# Patient Record
Sex: Female | Born: 1950 | Race: White | Hispanic: No | Marital: Married | Smoking: Former smoker
Health system: Southern US, Community
[De-identification: ages and names within clinical notes are randomized; demographics above are authoritative.]

## PROBLEM LIST (undated history)

## (undated) DIAGNOSIS — G709 Myoneural disorder, unspecified: Secondary | ICD-10-CM

## (undated) DIAGNOSIS — E079 Disorder of thyroid, unspecified: Secondary | ICD-10-CM

## (undated) DIAGNOSIS — G4730 Sleep apnea, unspecified: Secondary | ICD-10-CM

## (undated) DIAGNOSIS — I10 Essential (primary) hypertension: Secondary | ICD-10-CM

## (undated) DIAGNOSIS — E785 Hyperlipidemia, unspecified: Secondary | ICD-10-CM

## (undated) DIAGNOSIS — K219 Gastro-esophageal reflux disease without esophagitis: Secondary | ICD-10-CM

## (undated) HISTORY — DX: Disorder of thyroid, unspecified: E07.9

## (undated) HISTORY — PX: WISDOM TOOTH EXTRACTION: SHX21

## (undated) HISTORY — DX: Hyperlipidemia, unspecified: E78.5

## (undated) HISTORY — DX: Sleep apnea, unspecified: G47.30

## (undated) HISTORY — DX: Gastro-esophageal reflux disease without esophagitis: K21.9

## (undated) HISTORY — PX: HAMMER TOE SURGERY: SHX385

## (undated) HISTORY — DX: Myoneural disorder, unspecified: G70.9

## (undated) HISTORY — DX: Essential (primary) hypertension: I10

## (undated) HISTORY — PX: FRACTURE SURGERY: SHX138

---

## 1999-02-16 HISTORY — PX: WRIST FRACTURE SURGERY: SHX121

## 2011-02-16 HISTORY — PX: COLONOSCOPY: SHX174

## 2011-02-16 HISTORY — PX: OTHER SURGICAL HISTORY: SHX169

## 2012-08-01 DIAGNOSIS — R7303 Prediabetes: Secondary | ICD-10-CM | POA: Insufficient documentation

## 2013-03-23 DIAGNOSIS — H9190 Unspecified hearing loss, unspecified ear: Secondary | ICD-10-CM | POA: Insufficient documentation

## 2013-03-23 DIAGNOSIS — K219 Gastro-esophageal reflux disease without esophagitis: Secondary | ICD-10-CM | POA: Insufficient documentation

## 2018-02-15 HISTORY — PX: TREATMENT FISTULA ANAL: SUR1390

## 2018-07-05 DIAGNOSIS — Z86010 Personal history of colonic polyps: Secondary | ICD-10-CM | POA: Insufficient documentation

## 2018-07-05 DIAGNOSIS — M5136 Other intervertebral disc degeneration, lumbar region: Secondary | ICD-10-CM | POA: Insufficient documentation

## 2018-07-05 DIAGNOSIS — M51369 Other intervertebral disc degeneration, lumbar region without mention of lumbar back pain or lower extremity pain: Secondary | ICD-10-CM | POA: Insufficient documentation

## 2018-07-05 DIAGNOSIS — M8580 Other specified disorders of bone density and structure, unspecified site: Secondary | ICD-10-CM | POA: Insufficient documentation

## 2018-07-05 DIAGNOSIS — K648 Other hemorrhoids: Secondary | ICD-10-CM | POA: Insufficient documentation

## 2018-07-18 DIAGNOSIS — K603 Anal fistula: Secondary | ICD-10-CM | POA: Insufficient documentation

## 2019-12-14 ENCOUNTER — Ambulatory Visit (INDEPENDENT_AMBULATORY_CARE_PROVIDER_SITE_OTHER): Payer: Medicare Other | Admitting: Family

## 2019-12-14 ENCOUNTER — Other Ambulatory Visit: Payer: Self-pay

## 2019-12-14 ENCOUNTER — Encounter: Payer: Self-pay | Admitting: Family

## 2019-12-14 VITALS — BP 128/70 | HR 90 | Temp 98.5°F | Ht 65.0 in | Wt 247.0 lb

## 2019-12-14 DIAGNOSIS — Z1231 Encounter for screening mammogram for malignant neoplasm of breast: Secondary | ICD-10-CM

## 2019-12-14 DIAGNOSIS — M48061 Spinal stenosis, lumbar region without neurogenic claudication: Secondary | ICD-10-CM | POA: Diagnosis not present

## 2019-12-14 DIAGNOSIS — L661 Lichen planopilaris: Secondary | ICD-10-CM

## 2019-12-14 DIAGNOSIS — G4700 Insomnia, unspecified: Secondary | ICD-10-CM

## 2019-12-14 DIAGNOSIS — Z1211 Encounter for screening for malignant neoplasm of colon: Secondary | ICD-10-CM

## 2019-12-14 DIAGNOSIS — G4730 Sleep apnea, unspecified: Secondary | ICD-10-CM

## 2019-12-14 DIAGNOSIS — M79671 Pain in right foot: Secondary | ICD-10-CM

## 2019-12-14 DIAGNOSIS — E039 Hypothyroidism, unspecified: Secondary | ICD-10-CM

## 2019-12-14 DIAGNOSIS — E785 Hyperlipidemia, unspecified: Secondary | ICD-10-CM | POA: Insufficient documentation

## 2019-12-14 DIAGNOSIS — M79672 Pain in left foot: Secondary | ICD-10-CM

## 2019-12-14 DIAGNOSIS — I10 Essential (primary) hypertension: Secondary | ICD-10-CM

## 2019-12-14 DIAGNOSIS — L6612 Frontal fibrosing alopecia: Secondary | ICD-10-CM | POA: Insufficient documentation

## 2019-12-14 DIAGNOSIS — G4733 Obstructive sleep apnea (adult) (pediatric): Secondary | ICD-10-CM | POA: Insufficient documentation

## 2019-12-14 MED ORDER — PRAVASTATIN SODIUM 80 MG PO TABS: 80.0000 mg | ORAL_TABLET | Freq: Every day | ORAL | 1 refills | Status: DC

## 2019-12-14 MED ORDER — DILTIAZEM HCL ER BEADS 120 MG PO CP24: 120.0000 mg | ORAL_CAPSULE | Freq: Every day | ORAL | 1 refills | Status: DC

## 2019-12-14 MED ORDER — LEVOTHYROXINE SODIUM 112 MCG PO TABS: 112.0000 ug | ORAL_TABLET | Freq: Every day | ORAL | 1 refills | Status: DC

## 2019-12-14 MED ORDER — MELOXICAM 15 MG PO TABS: 15.0000 mg | ORAL_TABLET | Freq: Every day | ORAL | 1 refills | Status: DC

## 2019-12-14 MED ORDER — TRAZODONE HCL 100 MG PO TABS: 100.0000 mg | ORAL_TABLET | Freq: Every day | ORAL | 1 refills | Status: DC

## 2019-12-14 NOTE — Progress Notes (Signed)
Laura Hawkins is a 69 y.o. female with the following history as recorded in Epic[employer]:  Patient Active Problem List   Diagnosis Date Noted  . Spinal stenosis of lumbar region 12/14/2019  . Hypothyroidism 12/14/2019  . Essential hypertension 12/14/2019  . Hyperlipidemia 12/14/2019  . Insomnia 12/14/2019  . 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 Sleep apnea 12/14/2019    Current Outpatient Medications  Medication Sig Dispense Refill  . Cholecalciferol 25 MCG (1000 UT) capsule Take 2,000 Units by mouth daily.    Marland Kitchen diltiazem (TIAZAC) 120 MG 24 hr capsule Take 1 capsule (120 mg total) by mouth daily. 90 capsule 1  . hydroxychloroquine (PLAQUENIL) 200 MG tablet Take by mouth daily.    Marland Kitchen ibuprofen (ADVIL) 200 MG tablet Take 200 mg by mouth every 6 (six) hours as needed.    Marland Kitchen levothyroxine (SYNTHROID) 112 MCG tablet Take 1 tablet (112 mcg total) by mouth daily before breakfast. 90 tablet 1  . pravastatin (PRAVACHOL) 80 MG tablet Take 1 tablet (80 mg total) by mouth daily. 90 tablet 1  . traZODone (DESYREL) 100 MG tablet Take 1 tablet (100 mg total) by mouth at bedtime. 90 tablet 1  . meloxicam (MOBIC) 15 MG tablet Take 1 tablet (15 mg total) by mouth daily. 30 tablet 1   No current facility-administered medications for this visit.    Allergies: Penicillins, Bee venom, Poison ivy extract, and Poison oak extract  History reviewed. No pertinent past medical history.  Past Surgical History:  Procedure Laterality Date  . dislocated right elbow  2013  . TREATMENT FISTULA ANAL  2020  . WRIST FRACTURE SURGERY  2001    Family History  Problem Relation Age of Onset  . Dementia Mother     Social History   Tobacco Use  . Smoking status: Not on file  Substance Use Topics  . Alcohol use: Not on file    Subjective:  Patient presents as a new patient today; has recently moved to [facility] to help take care of her 28 yo mother;  Has already established with ophthalmology- Dr. Sabra Heck ( due to  use of hydroxychloroquine); dermatology will manage plaquenil; ( Plaquenil for frontal fibrosing alopecia);  Will need to get referral to neurosurgery for lumbar epidural follow-up;  Colonoscopy done in 2013- does know she had a polyp; unsure if it was hyperplastic or adenomatous; originally told needed 5 yr follow-up and then told that was wrong and had to 10 year follow-up;     Objective:  Vitals:   12/14/19 1023  BP: 128/70  Pulse: 90  Temp: 98.5 F (36.9 C)  TempSrc: Oral  SpO2: 95%  Weight: 247 lb (112 kg)  Height: 5\' 5"  (1.651 m)    General: Well developed, well nourished, in no acute distress  Skin : Warm and dry.  Head: Normocephalic and atraumatic  Lungs: Respirations unlabored; clear to auscultation bilaterally without wheeze, rales, rhonchi  CVS exam: normal rate and regular rhythm.  Neurologic: Alert and oriented; speech intact; face symmetrical; moves all extremities well; CNII-XII intact without focal deficit   Assessment:  1. Spinal stenosis of lumbar region, unspecified whether neurogenic claudication present   2. Encounter for screening colonoscopy   3. Screening mammogram for breast cancer   4. Bilateral foot pain   5. Hypothyroidism, unspecified type   6. Essential hypertension   7. Hyperlipidemia, unspecified hyperlipidemia type   8. Insomnia, unspecified type   9. Frontal fibrosing alopecia   10. Sleep apnea, unspecified type  Plan:  Refills updated and referrals updated as requested; Will plan for labs and Tdap in 3 months;   Time spent 30 minutes with patient;  This visit occurred during the SARS-CoV-2 public health emergency.  Safety protocols were in place, including screening questions prior to the visit, additional usage of staff PPE, and extensive cleaning of exam room while observing appropriate contact time as indicated for disinfecting solutions.     Return in about 3 months (around 03/15/2020) for AWV and OV with me.  Orders Placed  This Encounter  Procedures  . MM Digital Screening    Standing Status:   Future    Standing Expiration Date:   12/13/2020    Order Specific Question:   Reason for Exam (SYMPTOM  OR DIAGNOSIS REQUIRED)    Answer:   screening mammogram    Order Specific Question:   Preferred imaging location?    Answer:   [facility]  . Ambulatory referral to Neurosurgery    Referral Priority:   Routine    Referral Type:   Surgical    Referral Reason:   Specialty Services Required    Requested Specialty:   Neurosurgery    Number of Visits Requested:   1  . Ambulatory referral to Gastroenterology    Referral Priority:   Routine    Referral Type:   Consultation    Referral Reason:   Specialty Services Required    Number of Visits Requested:   1  . Ambulatory referral to Podiatry    Referral Priority:   Routine    Referral Type:   Consultation    Referral Reason:   Specialty Services Required    Requested Specialty:   Podiatry    Number of Visits Requested:   1    Requested Prescriptions   Signed Prescriptions Disp Refills  . meloxicam (MOBIC) 15 MG tablet 30 tablet 1    Sig: Take 1 tablet (15 mg total) by mouth daily.  Marland Kitchen diltiazem (TIAZAC) 120 MG 24 hr capsule 90 capsule 1    Sig: Take 1 capsule (120 mg total) by mouth daily.  Marland Kitchen levothyroxine (SYNTHROID) 112 MCG tablet 90 tablet 1    Sig: Take 1 tablet (112 mcg total) by mouth daily before breakfast.  . pravastatin (PRAVACHOL) 80 MG tablet 90 tablet 1    Sig: Take 1 tablet (80 mg total) by mouth daily.  . traZODone (DESYREL) 100 MG tablet 90 tablet 1    Sig: Take 1 tablet (100 mg total) by mouth at bedtime.

## 2020-01-02 ENCOUNTER — Encounter: Payer: Self-pay | Admitting: Podiatry

## 2020-01-02 ENCOUNTER — Other Ambulatory Visit: Payer: Self-pay | Admitting: Podiatry

## 2020-01-02 ENCOUNTER — Other Ambulatory Visit: Payer: Self-pay

## 2020-01-02 ENCOUNTER — Ambulatory Visit (INDEPENDENT_AMBULATORY_CARE_PROVIDER_SITE_OTHER): Payer: Medicare Other

## 2020-01-02 ENCOUNTER — Ambulatory Visit (INDEPENDENT_AMBULATORY_CARE_PROVIDER_SITE_OTHER): Payer: Medicare Other | Admitting: Podiatry

## 2020-01-02 DIAGNOSIS — B351 Tinea unguium: Secondary | ICD-10-CM | POA: Diagnosis not present

## 2020-01-02 DIAGNOSIS — M778 Other enthesopathies, not elsewhere classified: Secondary | ICD-10-CM | POA: Diagnosis not present

## 2020-01-02 DIAGNOSIS — M779 Enthesopathy, unspecified: Secondary | ICD-10-CM

## 2020-01-02 DIAGNOSIS — M79672 Pain in left foot: Secondary | ICD-10-CM

## 2020-01-02 DIAGNOSIS — M79671 Pain in right foot: Secondary | ICD-10-CM

## 2020-01-02 DIAGNOSIS — M79674 Pain in right toe(s): Secondary | ICD-10-CM | POA: Diagnosis not present

## 2020-01-02 DIAGNOSIS — M79675 Pain in left toe(s): Secondary | ICD-10-CM

## 2020-01-02 NOTE — Progress Notes (Signed)
Subjective:   Patient ID: Laura Hawkins, female   DOB: 69 y.o.   MRN: [id number]   HPI Patient presents stating she is got very thick nails on both feet that she cannot cut she has had hammertoe deformity and had history of ligament harvest surgery which seem to cause this right foot and she is got inflammation fluid around her fifth metatarsal left that is been painful with lesion and its been associated with it.  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  Patient does not smoke likes to be active   Review of Systems  All other systems reviewed and are negative.       Objective:  Physical Exam Vitals and nursing note reviewed.  Constitutional:      Appearance: She is well-developed.  Pulmonary:     Effort: Pulmonary effort is normal.  Musculoskeletal:        General: Normal range of motion.  Skin:    General: Skin is warm.  Neurological:     Mental Status: She is alert.     Neurovascular status intact muscle strength was found to be adequate range of motion is within normal limits.  Patient is found to have inflammation fluid buildup around the fifth MPJ left that is painful when pressed and makes walking difficult and is noted to have severely thickened yellow brittle nailbeds bilateral that are painful when pressed from a dorsal direction.  Patient has good digital perfusion well oriented x3     Assessment:  Acute inflammation fluid buildup around the fifth MPJ left with pain with lesion formation and mycotic nail infection with pain bilateral     Plan:  H&P reviewed both conditions and today I did debridement of nailbeds bilateral with no iatrogenic bleeding and for the left I did sterile prep injected the capsule 3 mg dexamethasone 5 mg Xylocaine debrided lesion and reappoint for routine care.  May require more aggressive treatment depending on response

## 2020-02-12 ENCOUNTER — Other Ambulatory Visit: Payer: Self-pay | Admitting: Family

## 2020-02-13 ENCOUNTER — Encounter: Payer: Self-pay | Admitting: Family

## 2020-02-14 ENCOUNTER — Encounter: Payer: Self-pay | Admitting: Family

## 2020-02-17 ENCOUNTER — Other Ambulatory Visit: Payer: Self-pay | Admitting: Family

## 2020-03-18 ENCOUNTER — Ambulatory Visit: Payer: Commercial Indemnity

## 2020-03-18 ENCOUNTER — Ambulatory Visit: Payer: Commercial Indemnity | Admitting: Family

## 2020-04-09 ENCOUNTER — Other Ambulatory Visit: Payer: Self-pay

## 2020-04-09 ENCOUNTER — Ambulatory Visit (INDEPENDENT_AMBULATORY_CARE_PROVIDER_SITE_OTHER): Payer: Medicare Other | Admitting: Podiatry

## 2020-04-09 ENCOUNTER — Encounter: Payer: Self-pay | Admitting: Podiatry

## 2020-04-09 DIAGNOSIS — M79674 Pain in right toe(s): Secondary | ICD-10-CM | POA: Diagnosis not present

## 2020-04-09 DIAGNOSIS — B351 Tinea unguium: Secondary | ICD-10-CM

## 2020-04-09 DIAGNOSIS — M79675 Pain in left toe(s): Secondary | ICD-10-CM | POA: Diagnosis not present

## 2020-04-09 NOTE — Progress Notes (Signed)
This patient returns to the office for evaluation and treatment of long thick painful nails .  This patient is unable to trim his own nails since the patient cannot reach his feet.  Patient says the nails are painful walking and wearing his shoes.  He returns for preventive foot care services.  General Appearance  Alert, conversant and in no acute stress.  Vascular  Dorsalis pedis and posterior tibial  pulses are palpable  bilaterally.  Capillary return is within normal limits  bilaterally. Temperature is within normal limits  bilaterally.  Neurologic  Senn-Weinstein monofilament wire test within normal limits  bilaterally. Muscle power within normal limits bilaterally.  Nails Thick disfigured discolored nails with subungual debris  from hallux to fifth toes bilaterally. No evidence of bacterial infection or drainage bilaterally.  Orthopedic  No limitations of motion  feet .  No crepitus or effusions noted.  No bony pathology or digital deformities noted.  Skin  normotropic skin with no porokeratosis noted bilaterally.  No signs of infections or ulcers noted.     Onychomycosis  Pain in toes right foot  Pain in toes left foot  Debridement  of nails  1-5  B/L with a nail nipper.  Nails were then filed using a dremel tool with no incidents.    RTC  3 months    Alan Riles DPM  

## 2020-04-15 ENCOUNTER — Ambulatory Visit (INDEPENDENT_AMBULATORY_CARE_PROVIDER_SITE_OTHER): Payer: Medicare Other

## 2020-04-15 ENCOUNTER — Ambulatory Visit: Payer: Commercial Indemnity | Admitting: Family

## 2020-04-15 ENCOUNTER — Other Ambulatory Visit: Payer: Self-pay | Admitting: Family

## 2020-04-15 ENCOUNTER — Other Ambulatory Visit: Payer: Self-pay

## 2020-04-15 VITALS — BP 122/70 | HR 63 | Temp 98.3°F | Ht 65.0 in | Wt 250.6 lb

## 2020-04-15 DIAGNOSIS — Z0000 Encounter for general adult medical examination without abnormal findings: Secondary | ICD-10-CM | POA: Diagnosis not present

## 2020-04-15 DIAGNOSIS — Z1231 Encounter for screening mammogram for malignant neoplasm of breast: Secondary | ICD-10-CM

## 2020-04-15 NOTE — Progress Notes (Addendum)
Subjective:   Rayven Rettig is a 70 y.o. female who presents for Medicare Annual (Subsequent) preventive examination.  Review of Systems    No ROS. Medicare Wellness Visit. Additional risk factors are reflected in social history. Cardiac Risk Factors include: advanced age (>21men, >38 women);dyslipidemia;hypertension;obesity (BMI >30kg/m2)     Objective:    Today's Vitals   04/15/20 0908  BP: 122/70  Pulse: 63  Temp: 98.3 F (36.8 C)  SpO2: 98%  Weight: 250 lb 9.6 oz (113.7 kg)  Height: 5\' 5"  (1.651 m)  PainSc: 0-No pain   Body mass index is 41.7 kg/m.  Advanced Directives 04/15/2020  Does Patient Have a Medical Advance Directive? No  Would patient like information on creating a medical advance directive? Yes (MAU/Ambulatory/Procedural Areas - Information given)    Current Medications (verified) Outpatient Encounter Medications as of 04/15/2020  Medication Sig  . Cholecalciferol 25 MCG (1000 UT) capsule Take 2,000 Units by mouth daily.  . clobetasol (TEMOVATE) 0.05 % external solution Apply topically.  Marland Kitchen diltiazem (CARDIZEM CD) 120 MG 24 hr capsule   . diltiazem (TIAZAC) 120 MG 24 hr capsule Take 1 capsule (120 mg total) by mouth daily.  . hydroxychloroquine (PLAQUENIL) 200 MG tablet Take by mouth daily.  Marland Kitchen ibuprofen (ADVIL) 200 MG tablet Take 200 mg by mouth every 6 (six) hours as needed.  Marland Kitchen levothyroxine (SYNTHROID) 112 MCG tablet Take 1 tablet (112 mcg total) by mouth daily before breakfast.  . lisinopril-hydrochlorothiazide (ZESTORETIC) 20-25 MG tablet Take 1 tablet by mouth daily.  . meloxicam (MOBIC) 15 MG tablet Take 1 tablet (15 mg total) by mouth daily.  . Omega-3 Fatty Acids (FISH OIL) 1000 MG CAPS Take by mouth.  . pantoprazole (PROTONIX) 40 MG tablet Take 40 mg by mouth 2 (two) times daily.  . pravastatin (PRAVACHOL) 80 MG tablet Take 1 tablet (80 mg total) by mouth daily.  . tacrolimus (PROTOPIC) 0.1 % ointment Apply 1 application topically 2 (two) times  daily.  . traZODone (DESYREL) 100 MG tablet Take 1 tablet (100 mg total) by mouth at bedtime.   No facility-administered encounter medications on file as of 04/15/2020.    Allergies (verified) Penicillins, Bee venom, Poison ivy extract, and Poison oak extract   History: History reviewed. No pertinent past medical history. Past Surgical History:  Procedure Laterality Date  . dislocated right elbow  2013  . TREATMENT FISTULA ANAL  2020  . WRIST FRACTURE SURGERY  2001   Family History  Problem Relation Age of Onset  . Dementia Mother    Social History   Socioeconomic History  . Marital status: Married    Spouse name: Not on file  . Number of children: Not on file  . Years of education: Not on file  . Highest education level: Not on file  Occupational History  . Not on file  Tobacco Use  . Smoking status: Former Research scientist (life sciences)  . Smokeless tobacco: Never Used  Substance and Sexual Activity  . Alcohol use: Yes    Comment: Social  . Drug use: Not on file  . Sexual activity: Yes    Partners: Male    Birth control/protection: Post-menopausal  Other Topics Concern  . Not on file  Social History Narrative  . Not on file   Social Determinants of Health   Financial Resource Strain: Low Risk   . Difficulty of Paying Living Expenses: Not hard at all  Food Insecurity: No Food Insecurity  . Worried About Charity fundraiser  in the Last Year: Never true  . Ran Out of Food in the Last Year: Never true  Transportation Needs: No Transportation Needs  . Lack of Transportation (Medical): No  . Lack of Transportation (Non-Medical): No  Physical Activity: Sufficiently Active  . Days of Exercise per Week: 5 days  . Minutes of Exercise per Session: 30 min  Stress: No Stress Concern Present  . Feeling of Stress : Not at all  Social Connections: Moderately Isolated  . Frequency of Communication with Friends and Family: More than three times a week  . Frequency of Social Gatherings with  Friends and Family: Once a week  . Attends Religious Services: Never  . Active Member of Clubs or Organizations: No  . Attends Archivist Meetings: Never  . Marital Status: Married    Tobacco Counseling Counseling given: Not Answered   Clinical Intake:  Pre-visit preparation completed: Yes  Pain : No/denies pain Pain Score: 0-No pain     BMI - recorded: 41.7 Nutritional Status: BMI > 30  Obese Nutritional Risks: None Diabetes: No  How often do you need to have someone help you when you read instructions, pamphlets, or other written materials from your doctor or pharmacy?: 1 - Never What is the last grade level you completed in school?: Master's Degree  Diabetic? no  Interpreter Needed?: No  Information entered by :: Shenika N. Hatfield, LPN   Activities of Daily Living In your present state of health, do you have any difficulty performing the following activities: 04/15/2020 12/14/2019  Hearing? Tempie Donning  Comment wears hearing aids -  Vision? N N  Difficulty concentrating or making decisions? N N  Walking or climbing stairs? N N  Dressing or bathing? N N  Doing errands, shopping? N N  Preparing Food and eating ? N -  Using the Toilet? N -  In the past six months, have you accidently leaked urine? Y -  Do you have problems with loss of bowel control? N -  Managing your Medications? N -  Managing your Finances? N -  Housekeeping or managing your Housekeeping? N -    Patient Care Team: Marrian Salvage, FNP as PCP - General (Internal Medicine)  Indicate any recent Medical Services you may have received from other than [facility] providers in the past year (date may be approximate).     Assessment:   This is a routine wellness examination for [place].  Hearing/Vision screen No exam data present  Dietary issues and exercise activities discussed: Current Exercise Habits: Home exercise routine, Type of exercise: walking, Time (Minutes): 30, Frequency  (Times/Week): 5, Weekly Exercise (Minutes/Week): 150, Intensity: Moderate, Exercise limited by: neurologic condition(s);orthopedic condition(s)  Goals    . Client understands the importance of follow-up with providers by attending scheduled visits     I would like to lose 100 pounds this year.      Depression Screen PHQ 2/9 Scores 04/15/2020 12/14/2019  PHQ - 2 Score 0 0    Fall Risk Fall Risk  04/15/2020  Falls in the past year? 0  Number falls in past yr: 0  Injury with Fall? 0  Risk for fall due to : No Fall Risks    FALL RISK PREVENTION PERTAINING TO THE HOME:  Any stairs in or around the home? Yes  If so, are there any without handrails? No  Home free of loose throw rugs in walkways, pet beds, electrical cords, etc? Yes  Adequate lighting in your home to reduce  risk of falls? Yes   ASSISTIVE DEVICES UTILIZED TO PREVENT FALLS:  Life alert? No  Use of a cane, walker or w/c? No  Grab bars in the bathroom? No  Shower chair or bench in shower? Yes  Elevated toilet seat or a handicapped toilet? Yes   TIMED UP AND GO:  Was the test performed? No .  Length of time to ambulate 10 feet: 0 sec.   Gait steady and fast without use of assistive device  Cognitive Function: Normal cognitive status assessed by direct observation by this Nurse Health Advisor. No abnormalities found.         Immunizations Immunization History  Administered Date(s) Administered  . Influenza Split 03/20/2013  . Influenza, High Dose Seasonal PF 01/01/2015, 12/05/2015, 02/25/2017, 11/11/2017, 10/16/2018  . Influenza-Unspecified 11/14/2019  . PFIZER(Purple Top)SARS-COV-2 Vaccination 03/15/2019, 04/05/2019, 11/14/2019  . Pneumococcal Conjugate-13 09/29/2015  . Pneumococcal Polysaccharide-23 09/20/2014  . Tdap 11/28/2008  . Zoster Recombinat (Shingrix) 05/12/2017, 07/16/2017    TDAP status: Due, Education has been provided regarding the importance of this vaccine. Advised may receive this vaccine  at local pharmacy or Health Dept. Aware to provide a copy of the vaccination record if obtained from local pharmacy or Health Dept. Verbalized acceptance and understanding.  Flu Vaccine status: Up to date  Pneumococcal vaccine status: Up to date  Covid-19 vaccine status: Completed vaccines  Qualifies for Shingles Vaccine? Yes   Zostavax completed Yes   Shingrix Completed?: Yes  Screening Tests Health Maintenance  Topic Date Due  . Hepatitis C Screening  Never done  . COLONOSCOPY (Pts 45-15yrs Insurance coverage will need to be confirmed)  Never done  . MAMMOGRAM  Never done  . TETANUS/TDAP  11/29/2018  . INFLUENZA VACCINE  Completed  . DEXA SCAN  Completed  . COVID-19 Vaccine  Completed  . PNA vac Low Risk Adult  Completed  . HPV VACCINES  Aged Out    Health Maintenance  Health Maintenance Due  Topic Date Due  . Hepatitis C Screening  Never done  . COLONOSCOPY (Pts 45-66yrs Insurance coverage will need to be confirmed)  Never done  . MAMMOGRAM  Never done  . TETANUS/TDAP  11/29/2018    Colorectal cancer screening: Type of screening: Colonoscopy. Completed 2013. Repeat every 10 years  Mammogram status: Completed ordered 12/14/2019. Repeat every year  Bone Density status: Completed 01/30/2019. Results reflect: Bone density results: OSTEOPENIA. Repeat every 2 years.  Lung Cancer Screening: (Low Dose CT Chest recommended if Age 79-80 years, 30 pack-year currently smoking OR have quit w/in 15years.) does not qualify.   Lung Cancer Screening Referral: no  Additional Screening:  Hepatitis C Screening: does qualify; Completed no  Vision Screening: Recommended annual ophthalmology exams for early detection of glaucoma and other disorders of the eye. Is the patient up to date with their annual eye exam?  Yes  Who is the provider or what is the name of the office in which the patient attends annual eye exams? [place] If pt is not established with a provider, would they  like to be referred to a provider to establish care? No .   Dental Screening: Recommended annual dental exams for proper oral hygiene  Community Resource Referral / Chronic Care Management: CRR required this visit?  No   CCM required this visit?  No      Plan:     I have personally reviewed and noted the following in the patient's chart:   . Medical and social history .  Use of alcohol, tobacco or illicit drugs  . Current medications and supplements . Functional ability and status . Nutritional status . Physical activity . Advanced directives . List of other physicians . Hospitalizations, surgeries, and ER visits in previous 12 months . Vitals . Screenings to include cognitive, depression, and falls . Referrals and appointments  In addition, I have reviewed and discussed with patient certain preventive protocols, quality metrics, and best practice recommendations. A written personalized care plan for preventive services as well as general preventive health recommendations were provided to patient.     Sheral Flow, LPN   06/20/8125   Nurse Notes:  Medications reviewed with patient; no opioid use noted.  Medical screening examination/treatment/procedure(s) were performed by non-physician practitioner and as supervising provider I was immediately available for consultation/collaboration.  I agree with above. Marrian Salvage, FNP

## 2020-04-15 NOTE — Patient Instructions (Signed)
Laura Hawkins , Thank you for taking time to come for your Medicare Wellness Visit. I appreciate your ongoing commitment to your health goals. Please review the following plan we discussed and let me know if I can assist you in the future.   Screening recommendations/referrals: Colonoscopy: 2013 in [place] Mammogram: ordered 12/14/2019 Bone Density: 01/30/2019 Recommended yearly ophthalmology/optometry visit for glaucoma screening and checkup Recommended yearly dental visit for hygiene and checkup  Vaccinations: Influenza vaccine: 11/14/2019 Pneumococcal vaccine: 09/20/2014, 09/29/2015 Tdap vaccine: 11/28/2008; due every 10 years (overdue) Shingles vaccine: 05/12/2017, 07/16/2017   Covid-19: 03/15/2019, 04/05/2019, 11/14/2019  Advanced directives: Advance directive discussed with you today. I have provided a copy for you to complete at home and have notarized. Once this is complete please bring a copy in to our office so we can scan it into your chart.  Conditions/risks identified: Yes; Reviewed health maintenance screenings with patient today and relevant education, vaccines, and/or referrals were provided. Please continue to do your personal lifestyle choices by: daily care of teeth and gums, regular physical activity (goal should be 5 days a week for 30 minutes), eat a healthy diet, avoid tobacco and drug use, limiting any alcohol intake, taking a low-dose aspirin (if not allergic or have been advised by your provider otherwise) and taking vitamins and minerals as recommended by your provider. Continue doing brain stimulating activities (puzzles, reading, adult coloring books, staying active) to keep memory sharp. Continue to eat heart healthy diet (full of fruits, vegetables, whole grains, lean protein, water--limit salt, fat, and sugar intake) and increase physical activity as tolerated.  Next appointment: Please schedule your next Medicare Wellness Visit with your Nurse Health Advisor in 1 year by  calling [phone].   Preventive Care 64 Years and Older, Female Preventive care refers to lifestyle choices and visits with your health care provider that can promote health and wellness. What does preventive care include?  A yearly physical exam. This is also called an annual well check.  Dental exams once or twice a year.  Routine eye exams. Ask your health care provider how often you should have your eyes checked.  Personal lifestyle choices, including:  Daily care of your teeth and gums.  Regular physical activity.  Eating a healthy diet.  Avoiding tobacco and drug use.  Limiting alcohol use.  Practicing safe sex.  Taking low-dose aspirin every day.  Taking vitamin and mineral supplements as recommended by your health care provider. What happens during an annual well check? The services and screenings done by your health care provider during your annual well check will depend on your age, overall health, lifestyle risk factors, and family history of disease. Counseling  Your health care provider may ask you questions about your:  Alcohol use.  Tobacco use.  Drug use.  Emotional well-being.  Home and relationship well-being.  Sexual activity.  Eating habits.  History of falls.  Memory and ability to understand (cognition).  Work and work Statistician.  Reproductive health. Screening  You may have the following tests or measurements:  Height, weight, and BMI.  Blood pressure.  Lipid and cholesterol levels. These may be checked every 5 years, or more frequently if you are over 73 years old.  Skin check.  Lung cancer screening. You may have this screening every year starting at age 52 if you have a 30-pack-year history of smoking and currently smoke or have quit within the past 15 years.  Fecal occult blood test (FOBT) of the stool. You may have this  test every year starting at age 68.  Flexible sigmoidoscopy or colonoscopy. You may have a  sigmoidoscopy every 5 years or a colonoscopy every 10 years starting at age 71.  Hepatitis C blood test.  Hepatitis B blood test.  Sexually transmitted disease (STD) testing.  Diabetes screening. This is done by checking your blood sugar (glucose) after you have not eaten for a while (fasting). You may have this done every 1-3 years.  Bone density scan. This is done to screen for osteoporosis. You may have this done starting at age 87.  Mammogram. This may be done every 1-2 years. Talk to your health care provider about how often you should have regular mammograms. Talk with your health care provider about your test results, treatment options, and if necessary, the need for more tests. Vaccines  Your health care provider may recommend certain vaccines, such as:  Influenza vaccine. This is recommended every year.  Tetanus, diphtheria, and acellular pertussis (Tdap, Td) vaccine. You may need a Td booster every 10 years.  Zoster vaccine. You may need this after age 11.  Pneumococcal 13-valent conjugate (PCV13) vaccine. One dose is recommended after age 15.  Pneumococcal polysaccharide (PPSV23) vaccine. One dose is recommended after age 5. Talk to your health care provider about which screenings and vaccines you need and how often you need them. This information is not intended to replace advice given to you by your health care provider. Make sure you discuss any questions you have with your health care provider. Document Released: 02/28/2015 Document Revised: 10/22/2015 Document Reviewed: 12/03/2014 Elsevier Interactive Patient Education  2017 [employer] Prevention in the Home Falls can cause injuries. They can happen to people of all ages. There are many things you can do to make your home safe and to help prevent falls. What can I do on the outside of my home?  Regularly fix the edges of walkways and driveways and fix any cracks.  Remove anything that might make you  trip as you walk through a door, such as a raised step or threshold.  Trim any bushes or trees on the path to your home.  Use bright outdoor lighting.  Clear any walking paths of anything that might make someone trip, such as rocks or tools.  Regularly check to see if handrails are loose or broken. Make sure that both sides of any steps have handrails.  Any raised decks and porches should have guardrails on the edges.  Have any leaves, snow, or ice cleared regularly.  Use sand or salt on walking paths during winter.  Clean up any spills in your garage right away. This includes oil or grease spills. What can I do in the bathroom?  Use night lights.  Install grab bars by the toilet and in the tub and shower. Do not use towel bars as grab bars.  Use non-skid mats or decals in the tub or shower.  If you need to sit down in the shower, use a plastic, non-slip stool.  Keep the floor dry. Clean up any water that spills on the floor as soon as it happens.  Remove soap buildup in the tub or shower regularly.  Attach bath mats securely with double-sided non-slip rug tape.  Do not have throw rugs and other things on the floor that can make you trip. What can I do in the bedroom?  Use night lights.  Make sure that you have a light by your bed that is easy to reach.  Do not use any sheets or blankets that are too big for your bed. They should not hang down onto the floor.  Have a firm chair that has side arms. You can use this for support while you get dressed.  Do not have throw rugs and other things on the floor that can make you trip. What can I do in the kitchen?  Clean up any spills right away.  Avoid walking on wet floors.  Keep items that you use a lot in easy-to-reach places.  If you need to reach something above you, use a strong step stool that has a grab bar.  Keep electrical cords out of the way.  Do not use floor polish or wax that makes floors slippery. If  you must use wax, use non-skid floor wax.  Do not have throw rugs and other things on the floor that can make you trip. What can I do with my stairs?  Do not leave any items on the stairs.  Make sure that there are handrails on both sides of the stairs and use them. Fix handrails that are broken or loose. Make sure that handrails are as long as the stairways.  Check any carpeting to make sure that it is firmly attached to the stairs. Fix any carpet that is loose or worn.  Avoid having throw rugs at the top or bottom of the stairs. If you do have throw rugs, attach them to the floor with carpet tape.  Make sure that you have a light switch at the top of the stairs and the bottom of the stairs. If you do not have them, ask someone to add them for you. What else can I do to help prevent falls?  Wear shoes that:  Do not have high heels.  Have rubber bottoms.  Are comfortable and fit you well.  Are closed at the toe. Do not wear sandals.  If you use a stepladder:  Make sure that it is fully opened. Do not climb a closed stepladder.  Make sure that both sides of the stepladder are locked into place.  Ask someone to hold it for you, if possible.  Clearly mark and make sure that you can see:  Any grab bars or handrails.  First and last steps.  Where the edge of each step is.  Use tools that help you move around (mobility aids) if they are needed. These include:  Canes.  Walkers.  Scooters.  Crutches.  Turn on the lights when you go into a dark area. Replace any light bulbs as soon as they burn out.  Set up your furniture so you have a clear path. Avoid moving your furniture around.  If any of your floors are uneven, fix them.  If there are any pets around you, be aware of where they are.  Review your medicines with your doctor. Some medicines can make you feel dizzy. This can increase your chance of falling. Ask your doctor what other things that you can do to  help prevent falls. This information is not intended to replace advice given to you by your health care provider. Make sure you discuss any questions you have with your health care provider. Document Released: 11/28/2008 Document Revised: 07/10/2015 Document Reviewed: 03/08/2014 Elsevier Interactive Patient Education  2017 [employer].

## 2020-04-21 ENCOUNTER — Other Ambulatory Visit: Payer: Self-pay | Admitting: Family

## 2020-06-23 ENCOUNTER — Other Ambulatory Visit: Payer: Self-pay

## 2020-06-24 ENCOUNTER — Ambulatory Visit (INDEPENDENT_AMBULATORY_CARE_PROVIDER_SITE_OTHER): Payer: Medicare Other | Admitting: Internal Medicine

## 2020-06-24 ENCOUNTER — Other Ambulatory Visit: Payer: Self-pay

## 2020-06-24 ENCOUNTER — Encounter: Payer: Self-pay | Admitting: Internal Medicine

## 2020-06-24 VITALS — BP 136/80 | HR 68 | Temp 98.2°F | Ht 65.0 in | Wt 250.8 lb

## 2020-06-24 DIAGNOSIS — I10 Essential (primary) hypertension: Secondary | ICD-10-CM | POA: Diagnosis not present

## 2020-06-24 DIAGNOSIS — Z1231 Encounter for screening mammogram for malignant neoplasm of breast: Secondary | ICD-10-CM

## 2020-06-24 DIAGNOSIS — R7303 Prediabetes: Secondary | ICD-10-CM | POA: Diagnosis not present

## 2020-06-24 DIAGNOSIS — L661 Lichen planopilaris: Secondary | ICD-10-CM

## 2020-06-24 DIAGNOSIS — G4733 Obstructive sleep apnea (adult) (pediatric): Secondary | ICD-10-CM

## 2020-06-24 DIAGNOSIS — E039 Hypothyroidism, unspecified: Secondary | ICD-10-CM

## 2020-06-24 DIAGNOSIS — E782 Mixed hyperlipidemia: Secondary | ICD-10-CM

## 2020-06-24 LAB — COMPREHENSIVE METABOLIC PANEL
ALT: 19 U/L (ref 0–35)
AST: 22 U/L (ref 0–37)
Albumin: 4.3 g/dL (ref 3.5–5.2)
Alkaline Phosphatase: 48 U/L (ref 39–117)
BUN: 30 mg/dL — ABNORMAL HIGH (ref 6–23)
CO2: 28 mEq/L (ref 19–32)
Calcium: 9.2 mg/dL (ref 8.4–10.5)
Chloride: 103 mEq/L (ref 96–112)
Creatinine, Ser: 1.01 mg/dL (ref 0.40–1.20)
GFR: 56.24 mL/min — ABNORMAL LOW (ref >60.00)
Glucose, Bld: 94 mg/dL (ref 70–99)
Potassium: 3.8 mEq/L (ref 3.5–5.1)
Sodium: 140 mEq/L (ref 135–145)
Total Bilirubin: 0.5 mg/dL (ref 0.2–1.2)
Total Protein: 6.7 g/dL (ref 6.0–8.3)

## 2020-06-24 LAB — LIPID PANEL
Cholesterol: 173 mg/dL (ref 0–200)
HDL: 68.7 mg/dL (ref >39.00)
LDL Cholesterol: 80 mg/dL (ref 0–99)
NonHDL: 104.25
Total CHOL/HDL Ratio: 3
Triglycerides: 120 mg/dL (ref 0.0–149.0)
VLDL: 24 mg/dL (ref 0.0–40.0)

## 2020-06-24 LAB — CBC
HCT: 37.5 % (ref 36.0–46.0)
Hemoglobin: 13 g/dL (ref 12.0–15.0)
MCHC: 34.8 g/dL (ref 30.0–36.0)
MCV: 87.2 fl (ref 78.0–100.0)
Platelets: 209 10*3/uL (ref 150.0–400.0)
RBC: 4.3 Mil/uL (ref 3.87–5.11)
RDW: 14.2 % (ref 11.5–15.5)
WBC: 7.6 10*3/uL (ref 4.0–10.5)

## 2020-06-24 LAB — HEMOGLOBIN A1C: Hgb A1c MFr Bld: 5.7 % (ref 4.6–6.5)

## 2020-06-24 LAB — TSH: TSH: 0.57 u[IU]/mL (ref 0.35–4.50)

## 2020-06-24 NOTE — Patient Instructions (Signed)
We will monitor the sleep apnea when needed let us know your home health company.  We are checking the labs today.

## 2020-06-24 NOTE — Progress Notes (Signed)
   Subjective:   Patient ID: Laura Hawkins, female    DOB: Jan 05, 1951, 70 y.o.   MRN: [id number]  HPI The patient is a 70 YO female coming in for transition of care and follow up sleep apnea (needs someone to oversee her CPAP, using for a long time, uses nightly, denies problems), and pre-diabetes (overall stable, diet is okay, no regular exercise), and blood pressure (taking diltiazem and lisinopril/hctz, denies side effects or headaches or chest pains).   PMH, [facility], social history reviewed and updated  Review of Systems  Constitutional: Negative.   HENT: Negative.   Eyes: Negative.   Respiratory: Negative for cough, chest tightness and shortness of breath.   Cardiovascular: Negative for chest pain, palpitations and leg swelling.  Gastrointestinal: Negative for abdominal distention, abdominal pain, constipation, diarrhea, nausea and vomiting.  Musculoskeletal: Negative.   Skin: Negative.   Neurological: Negative.   Psychiatric/Behavioral: Negative.     Objective:  Physical Exam Constitutional:      Appearance: She is well-developed. She is obese.  HENT:     Head: Normocephalic and atraumatic.     Comments: Frontal alopecia Cardiovascular:     Rate and Rhythm: Normal rate and regular rhythm.  Pulmonary:     Effort: Pulmonary effort is normal. No respiratory distress.     Breath sounds: Normal breath sounds. No wheezing or rales.  Abdominal:     General: Bowel sounds are normal. There is no distension.     Palpations: Abdomen is soft.     Tenderness: There is no abdominal tenderness. There is no rebound.  Musculoskeletal:     Cervical back: Normal range of motion.  Skin:    General: Skin is warm and dry.  Neurological:     Mental Status: She is alert and oriented to person, place, and time.     Coordination: Coordination normal.     Vitals:   06/24/20 1312  BP: 136/80  Pulse: 68  Temp: 98.2 F (36.8 C)  TempSrc: Oral  SpO2: 97%  Weight: 250 lb 12.8 oz (113.8 kg)   Height: 5\' 5"  (1.651 m)    This visit occurred during the SARS-CoV-2 public health emergency.  Safety protocols were in place, including screening questions prior to the visit, additional usage of staff PPE, and extensive cleaning of exam room while observing appropriate contact time as indicated for disinfecting solutions.   Assessment & Plan:

## 2020-06-27 NOTE — Assessment & Plan Note (Signed)
Checking HgA1c and adjust as needed.  

## 2020-06-27 NOTE — Assessment & Plan Note (Signed)
Checking lipid panel and adjust as needed pravastatin.

## 2020-06-27 NOTE — Assessment & Plan Note (Signed)
Seeing derm and on plaquenil. Yearly eye exam.

## 2020-06-27 NOTE — Assessment & Plan Note (Signed)
Can oversee supplies and she uses nightly and gets symptomatic benefit from this.

## 2020-06-27 NOTE — Assessment & Plan Note (Signed)
BP at goal, checking CMP and adjust diltiazem, lisinopril, hctz if needed.

## 2020-06-27 NOTE — Assessment & Plan Note (Signed)
Checking TSH and adjust synthroid 112 mcg daily as needed.  ?

## 2020-07-05 ENCOUNTER — Other Ambulatory Visit: Payer: Self-pay | Admitting: Family

## 2020-07-08 ENCOUNTER — Other Ambulatory Visit: Payer: Self-pay | Admitting: Internal Medicine

## 2020-07-09 ENCOUNTER — Encounter: Payer: Self-pay | Admitting: Podiatry

## 2020-07-09 ENCOUNTER — Ambulatory Visit (INDEPENDENT_AMBULATORY_CARE_PROVIDER_SITE_OTHER): Payer: Medicare Other | Admitting: Podiatry

## 2020-07-09 ENCOUNTER — Other Ambulatory Visit: Payer: Self-pay

## 2020-07-09 DIAGNOSIS — M79674 Pain in right toe(s): Secondary | ICD-10-CM | POA: Diagnosis not present

## 2020-07-09 DIAGNOSIS — M79675 Pain in left toe(s): Secondary | ICD-10-CM

## 2020-07-09 DIAGNOSIS — B351 Tinea unguium: Secondary | ICD-10-CM

## 2020-07-09 NOTE — Progress Notes (Signed)
This patient returns to the office for evaluation and treatment of long thick painful nails .  This patient is unable to trim his own nails since the patient cannot reach his feet.  Patient says the nails are painful walking and wearing his shoes.  He returns for preventive foot care services.  General Appearance  Alert, conversant and in no acute stress.  Vascular  Dorsalis pedis and posterior tibial  pulses are palpable  bilaterally.  Capillary return is within normal limits  bilaterally. Temperature is within normal limits  bilaterally.  Neurologic  Senn-Weinstein monofilament wire test within normal limits  bilaterally. Muscle power within normal limits bilaterally.  Nails Thick disfigured discolored nails with subungual debris  from hallux to fifth toes bilaterally. No evidence of bacterial infection or drainage bilaterally.  Orthopedic  No limitations of motion  feet .  No crepitus or effusions noted.  No bony pathology or digital deformities noted.  Skin  normotropic skin with no porokeratosis noted bilaterally.  No signs of infections or ulcers noted.     Onychomycosis  Pain in toes right foot  Pain in toes left foot  Debridement  of nails  1-5  B/L with a nail nipper.  Nails were then filed using a dremel tool with no incidents.    RTC  3 months    Kalden Wanke DPM  

## 2020-07-16 ENCOUNTER — Other Ambulatory Visit: Payer: Self-pay | Admitting: Family

## 2020-07-17 ENCOUNTER — Encounter: Payer: Self-pay | Admitting: Internal Medicine

## 2020-07-17 ENCOUNTER — Other Ambulatory Visit: Payer: Self-pay | Admitting: Family

## 2020-07-18 MED ORDER — MELOXICAM 15 MG PO TABS: 15.0000 mg | ORAL_TABLET | Freq: Every day | ORAL | 0 refills | Status: DC

## 2020-08-04 ENCOUNTER — Encounter: Payer: Self-pay | Admitting: Internal Medicine

## 2020-08-05 MED ORDER — PANTOPRAZOLE SODIUM 40 MG PO TBEC: 40.0000 mg | DELAYED_RELEASE_TABLET | Freq: Every day | ORAL | 3 refills | Status: DC

## 2020-09-03 ENCOUNTER — Ambulatory Visit: Payer: Medicare Other

## 2020-10-14 ENCOUNTER — Other Ambulatory Visit: Payer: Self-pay

## 2020-10-14 ENCOUNTER — Ambulatory Visit
Admission: RE | Admit: 2020-10-14 | Discharge: 2020-10-14 | Disposition: A | Discharge status: Home / Self Care | Payer: Medicare Other | Source: Ambulatory Visit | Attending: Internal Medicine | Admitting: Internal Medicine

## 2020-10-14 DIAGNOSIS — Z1231 Encounter for screening mammogram for malignant neoplasm of breast: Secondary | ICD-10-CM

## 2020-10-15 ENCOUNTER — Ambulatory Visit (INDEPENDENT_AMBULATORY_CARE_PROVIDER_SITE_OTHER): Payer: Medicare Other | Admitting: Podiatry

## 2020-10-15 ENCOUNTER — Encounter: Payer: Self-pay | Admitting: Podiatry

## 2020-10-15 DIAGNOSIS — M79675 Pain in left toe(s): Secondary | ICD-10-CM

## 2020-10-15 DIAGNOSIS — B351 Tinea unguium: Secondary | ICD-10-CM

## 2020-10-15 DIAGNOSIS — M79674 Pain in right toe(s): Secondary | ICD-10-CM | POA: Diagnosis not present

## 2020-10-15 NOTE — Progress Notes (Signed)
This patient returns to the office for evaluation and treatment of long thick painful nails .  This patient is unable to trim his own nails since the patient cannot reach his feet.  Patient says the nails are painful walking and wearing his shoes.  He returns for preventive foot care services.  General Appearance  Alert, conversant and in no acute stress.  Vascular  Dorsalis pedis and posterior tibial  pulses are palpable  bilaterally.  Capillary return is within normal limits  bilaterally. Temperature is within normal limits  bilaterally.  Neurologic  Senn-Weinstein monofilament wire test within normal limits  bilaterally. Muscle power within normal limits bilaterally.  Nails Thick disfigured discolored nails with subungual debris  from hallux to fifth toes bilaterally. No evidence of bacterial infection or drainage bilaterally.  Orthopedic  No limitations of motion  feet .  No crepitus or effusions noted.  No bony pathology or digital deformities noted.  Skin  normotropic skin with no porokeratosis noted bilaterally.  No signs of infections or ulcers noted.     Onychomycosis  Pain in toes right foot  Pain in toes left foot  Debridement  of nails  1-5  B/L with a nail nipper.  Nails were then filed using a dremel tool with no incidents.    RTC  3 months    Zymir Napoli DPM  

## 2020-10-16 ENCOUNTER — Other Ambulatory Visit: Payer: Self-pay | Admitting: Internal Medicine

## 2020-10-17 ENCOUNTER — Other Ambulatory Visit: Payer: Self-pay | Admitting: Internal Medicine

## 2020-11-17 ENCOUNTER — Other Ambulatory Visit: Payer: Self-pay | Admitting: Internal Medicine

## 2020-12-10 ENCOUNTER — Telehealth: Payer: Self-pay | Admitting: Internal Medicine

## 2020-12-10 ENCOUNTER — Encounter: Payer: Self-pay | Admitting: Internal Medicine

## 2020-12-10 DIAGNOSIS — G4733 Obstructive sleep apnea (adult) (pediatric): Secondary | ICD-10-CM

## 2020-12-10 NOTE — Telephone Encounter (Signed)
Patient says her cpap machine stopped working last night  Would like for doctor to write her an rx for a new one & send to a local DME supply store that sells cpap machines & equipment (patient did not know of any stores in her area)  Please call patient as soon as possible [phone]

## 2020-12-10 NOTE — Telephone Encounter (Signed)
Order placed okay to fax to any home health company she desires.

## 2020-12-10 NOTE — Telephone Encounter (Signed)
See below

## 2020-12-10 NOTE — Telephone Encounter (Signed)
Spoke with the patient to let her know that a order has been placed with [facility] for a new cpap machine. She was very appreciative for the call.

## 2020-12-11 ENCOUNTER — Encounter: Payer: Self-pay | Admitting: Internal Medicine

## 2020-12-11 NOTE — Telephone Encounter (Signed)
Patient calling in regarding CPAP machine  Patient says she needs the contact info for [facility] so she can know how to receive her equipment  Please call [phone] .Marland Kitchen also okay to leave vm

## 2020-12-12 ENCOUNTER — Encounter: Payer: Self-pay | Admitting: Internal Medicine

## 2020-12-12 NOTE — Telephone Encounter (Signed)
Got in touch with [facility] in regards to her cpap order. They should contact her shortly. Called pt. LDVM with an update. Mychart message will be sent as well.

## 2020-12-15 NOTE — Telephone Encounter (Signed)
See below

## 2020-12-15 NOTE — Telephone Encounter (Signed)
Patient is needing a prescription for a travel CPAP machine that you are paying for out of pocket until [place] can get her one for home  Email script to pt at [email or web address] or she can come pick the script up if it can't be emailed to her  If we can send the script, they can overnight the travel CPAP to her  Please follow-up with the patient at [phone]

## 2020-12-22 NOTE — Telephone Encounter (Signed)
Office notes have been faxed.

## 2020-12-22 NOTE — Telephone Encounter (Signed)
[place]   [phone] Office note that shows benefits and usage of the CPAP and a copy of her sleep study  Perhaps update last office note, Leroy Sea stated that would be acceptable

## 2020-12-25 ENCOUNTER — Other Ambulatory Visit: Payer: Self-pay

## 2020-12-25 ENCOUNTER — Encounter: Payer: Self-pay | Admitting: Internal Medicine

## 2020-12-25 ENCOUNTER — Ambulatory Visit (INDEPENDENT_AMBULATORY_CARE_PROVIDER_SITE_OTHER): Payer: Medicare Other | Admitting: Internal Medicine

## 2020-12-25 VITALS — BP 124/74 | HR 74 | Resp 18 | Ht 65.0 in | Wt 251.4 lb

## 2020-12-25 DIAGNOSIS — I10 Essential (primary) hypertension: Secondary | ICD-10-CM | POA: Diagnosis not present

## 2020-12-25 DIAGNOSIS — R7303 Prediabetes: Secondary | ICD-10-CM | POA: Diagnosis not present

## 2020-12-25 DIAGNOSIS — L661 Lichen planopilaris: Secondary | ICD-10-CM | POA: Diagnosis not present

## 2020-12-25 DIAGNOSIS — G4733 Obstructive sleep apnea (adult) (pediatric): Secondary | ICD-10-CM | POA: Diagnosis not present

## 2020-12-25 LAB — COMPREHENSIVE METABOLIC PANEL
ALT: 17 U/L (ref 0–35)
AST: 20 U/L (ref 0–37)
Albumin: 4.4 g/dL (ref 3.5–5.2)
Alkaline Phosphatase: 50 U/L (ref 39–117)
BUN: 27 mg/dL — ABNORMAL HIGH (ref 6–23)
CO2: 28 mEq/L (ref 19–32)
Calcium: 9.2 mg/dL (ref 8.4–10.5)
Chloride: 105 mEq/L (ref 96–112)
Creatinine, Ser: 1.05 mg/dL (ref 0.40–1.20)
GFR: 53.49 mL/min — ABNORMAL LOW (ref >60.00)
Glucose, Bld: 93 mg/dL (ref 70–99)
Potassium: 4.4 mEq/L (ref 3.5–5.1)
Sodium: 140 mEq/L (ref 135–145)
Total Bilirubin: 0.6 mg/dL (ref 0.2–1.2)
Total Protein: 6.7 g/dL (ref 6.0–8.3)

## 2020-12-25 LAB — CBC
HCT: 39.8 % (ref 36.0–46.0)
Hemoglobin: 13.2 g/dL (ref 12.0–15.0)
MCHC: 33.3 g/dL (ref 30.0–36.0)
MCV: 90.1 fl (ref 78.0–100.0)
Platelets: 207 10*3/uL (ref 150.0–400.0)
RBC: 4.42 Mil/uL (ref 3.87–5.11)
RDW: 12.9 % (ref 11.5–15.5)
WBC: 6.9 10*3/uL (ref 4.0–10.5)

## 2020-12-25 NOTE — Progress Notes (Signed)
   Subjective:   Patient ID: Laura Hawkins, female    DOB: 07-01-1950, 69 y.o.   MRN: [id number]  HPI The patient is a 70 YO female coming in for follow up.   Review of Systems  Constitutional: Negative.   HENT: Negative.    Eyes: Negative.   Respiratory:  Negative for cough, chest tightness and shortness of breath.   Cardiovascular:  Negative for chest pain, palpitations and leg swelling.  Gastrointestinal:  Negative for abdominal distention, abdominal pain, constipation, diarrhea, nausea and vomiting.  Musculoskeletal: Negative.   Skin: Negative.   Neurological: Negative.   Psychiatric/Behavioral: Negative.     Objective:  Physical Exam Constitutional:      Appearance: She is well-developed.  HENT:     Head: Normocephalic and atraumatic.  Cardiovascular:     Rate and Rhythm: Normal rate and regular rhythm.  Pulmonary:     Effort: Pulmonary effort is normal. No respiratory distress.     Breath sounds: Normal breath sounds. No wheezing or rales.  Abdominal:     General: Bowel sounds are normal. There is no distension.     Palpations: Abdomen is soft.     Tenderness: There is no abdominal tenderness. There is no rebound.  Musculoskeletal:     Cervical back: Normal range of motion.  Skin:    General: Skin is warm and dry.  Neurological:     Mental Status: She is alert and oriented to person, place, and time.     Coordination: Coordination normal.    Vitals:   12/25/20 1042  BP: 124/74  Pulse: 74  Resp: 18  SpO2: 97%  Weight: 251 lb 6.4 oz (114 kg)  Height: 5\' 5"  (1.651 m)    This visit occurred during the SARS-CoV-2 public health emergency.  Safety protocols were in place, including screening questions prior to the visit, additional usage of staff PPE, and extensive cleaning of exam room while observing appropriate contact time as indicated for disinfecting solutions.   Assessment & Plan:

## 2020-12-25 NOTE — Patient Instructions (Signed)
We will check the labs today. 

## 2020-12-26 NOTE — Assessment & Plan Note (Signed)
Recent labs reviewed with her and she is still with pre-diabetes. Recommend yearly monitoring and sooner with significant weight change or diet change. Diet and weight stable so no labs ordered today.

## 2020-12-26 NOTE — Assessment & Plan Note (Signed)
BP stable on lisinopril/hctz and diltiazem. Checking CMP and adjust as needed.

## 2020-12-26 NOTE — Assessment & Plan Note (Signed)
She has been having difficulty in obtaining replacement CPAP and we are still following up with this between the home health company and patient updated on situation during visit.

## 2020-12-26 NOTE — Assessment & Plan Note (Signed)
Given that she is on plaquenil needs lab monitoring with CBC and CMP ordered today.

## 2020-12-30 NOTE — Telephone Encounter (Signed)
Patient calling in  Patient said she spoke w/ [place] they are advising they never received fax  Patient is requesting fax be submitted again to [place] [phone] (F)  Please let patient know when this has been done [phone]

## 2021-01-13 NOTE — Telephone Encounter (Signed)
Patient states [facility] has not received her sleep study results or office visit letter  Patient states [facility] is requesting the information faxed to [phone]

## 2021-01-13 NOTE — Telephone Encounter (Signed)
Melissa from [facility] called to follow up on the CPAP progress notes and sleep study results. Stated they never received the fax and she was unable to see it in [place]. Requesting a callback to get an update.   Best contact #: [phone]

## 2021-01-19 ENCOUNTER — Other Ambulatory Visit: Payer: Self-pay | Admitting: Family

## 2021-01-19 ENCOUNTER — Other Ambulatory Visit: Payer: Self-pay | Admitting: Internal Medicine

## 2021-01-21 ENCOUNTER — Ambulatory Visit (INDEPENDENT_AMBULATORY_CARE_PROVIDER_SITE_OTHER): Payer: Medicare Other | Admitting: Podiatry

## 2021-01-21 ENCOUNTER — Encounter: Payer: Self-pay | Admitting: Podiatry

## 2021-01-21 ENCOUNTER — Other Ambulatory Visit: Payer: Self-pay

## 2021-01-21 DIAGNOSIS — M79674 Pain in right toe(s): Secondary | ICD-10-CM

## 2021-01-21 DIAGNOSIS — L608 Other nail disorders: Secondary | ICD-10-CM | POA: Diagnosis not present

## 2021-01-21 DIAGNOSIS — M79672 Pain in left foot: Secondary | ICD-10-CM | POA: Diagnosis not present

## 2021-01-21 DIAGNOSIS — B351 Tinea unguium: Secondary | ICD-10-CM | POA: Diagnosis not present

## 2021-01-21 DIAGNOSIS — M79675 Pain in left toe(s): Secondary | ICD-10-CM

## 2021-01-21 DIAGNOSIS — M79671 Pain in right foot: Secondary | ICD-10-CM | POA: Diagnosis not present

## 2021-01-21 NOTE — Progress Notes (Signed)
This patient returns to the office for evaluation and treatment of long thick painful nails .  This patient is unable to trim his own nails since the patient cannot reach his feet.  Patient says the nails are painful walking and wearing his shoes.  He returns for preventive foot care services.  General Appearance  Alert, conversant and in no acute stress.  Vascular  Dorsalis pedis and posterior tibial  pulses are palpable  bilaterally.  Capillary return is within normal limits  bilaterally. Temperature is within normal limits  bilaterally.  Neurologic  Senn-Weinstein monofilament wire test within normal limits  bilaterally. Muscle power within normal limits bilaterally.  Nails Thick disfigured discolored nails with subungual debris  from hallux to fifth toes bilaterally. No evidence of bacterial infection or drainage bilaterally.  Orthopedic  No limitations of motion  feet .  No crepitus or effusions noted.  No bony pathology or digital deformities noted.  Skin  normotropic skin with no porokeratosis noted bilaterally.  No signs of infections or ulcers noted.     Onychomycosis  Pain in toes right foot  Pain in toes left foot  Debridement  of nails  1-5  B/L with a nail nipper.  Nails were then filed using a dremel tool with no incidents.    RTC  3 months    Nabor Thomann DPM  

## 2021-01-22 ENCOUNTER — Other Ambulatory Visit: Payer: Self-pay | Admitting: Family

## 2021-02-18 ENCOUNTER — Other Ambulatory Visit: Payer: Self-pay | Admitting: Internal Medicine

## 2021-02-23 ENCOUNTER — Encounter: Payer: Self-pay | Admitting: Internal Medicine

## 2021-03-12 ENCOUNTER — Encounter: Payer: Self-pay | Admitting: Internal Medicine

## 2021-04-13 ENCOUNTER — Telehealth: Payer: Self-pay | Admitting: Internal Medicine

## 2021-04-13 NOTE — Telephone Encounter (Signed)
Left message for patient to call back to schedule Medicare Annual Wellness Visit   Last AWV  04/15/20  Please schedule at anytime with [facility] if patient calls the office back.    40 Minutes appointment   Any questions, please call me at [phone]

## 2021-04-24 ENCOUNTER — Other Ambulatory Visit: Payer: Self-pay

## 2021-04-24 ENCOUNTER — Encounter: Payer: Self-pay | Admitting: Podiatry

## 2021-04-24 ENCOUNTER — Ambulatory Visit (INDEPENDENT_AMBULATORY_CARE_PROVIDER_SITE_OTHER): Payer: Medicare Other | Admitting: Podiatry

## 2021-04-24 DIAGNOSIS — M79672 Pain in left foot: Secondary | ICD-10-CM

## 2021-04-24 DIAGNOSIS — M79675 Pain in left toe(s): Secondary | ICD-10-CM | POA: Diagnosis not present

## 2021-04-24 DIAGNOSIS — L608 Other nail disorders: Secondary | ICD-10-CM

## 2021-04-24 DIAGNOSIS — M79671 Pain in right foot: Secondary | ICD-10-CM

## 2021-04-24 DIAGNOSIS — B351 Tinea unguium: Secondary | ICD-10-CM | POA: Diagnosis not present

## 2021-04-24 DIAGNOSIS — M79674 Pain in right toe(s): Secondary | ICD-10-CM

## 2021-04-24 NOTE — Progress Notes (Signed)
This patient returns to the office for evaluation and treatment of long thick painful nails .  This patient is unable to trim his own nails since the patient cannot reach his feet.  Patient says the nails are painful walking and wearing his shoes.  He returns for preventive foot care services.  General Appearance  Alert, conversant and in no acute stress.  Vascular  Dorsalis pedis and posterior tibial  pulses are palpable  bilaterally.  Capillary return is within normal limits  bilaterally. Temperature is within normal limits  bilaterally.  Neurologic  Senn-Weinstein monofilament wire test within normal limits  bilaterally. Muscle power within normal limits bilaterally.  Nails Thick disfigured discolored nails with subungual debris  from hallux to fifth toes bilaterally. No evidence of bacterial infection or drainage bilaterally.  Orthopedic  No limitations of motion  feet .  No crepitus or effusions noted.  No bony pathology or digital deformities noted.  Skin  normotropic skin with no porokeratosis noted bilaterally.  No signs of infections or ulcers noted.     Onychomycosis  Pain in toes right foot  Pain in toes left foot  Debridement  of nails  1-5  B/L with a nail nipper.  Nails were then filed using a dremel tool with no incidents.    RTC  3 months    Gurinder Toral DPM  

## 2021-04-29 ENCOUNTER — Other Ambulatory Visit: Payer: Self-pay | Admitting: Internal Medicine

## 2021-04-29 ENCOUNTER — Other Ambulatory Visit: Payer: Self-pay | Admitting: Family

## 2021-05-04 ENCOUNTER — Ambulatory Visit (INDEPENDENT_AMBULATORY_CARE_PROVIDER_SITE_OTHER): Payer: Medicare Other

## 2021-05-04 DIAGNOSIS — Z1211 Encounter for screening for malignant neoplasm of colon: Secondary | ICD-10-CM

## 2021-05-04 DIAGNOSIS — Z0000 Encounter for general adult medical examination without abnormal findings: Secondary | ICD-10-CM | POA: Diagnosis not present

## 2021-05-04 NOTE — Progress Notes (Signed)
? ?Subjective:  ? Laura Hawkins is a 71 y.o. female who presents for Medicare Annual (Subsequent) preventive examination. ? ? ?I connected with Laura Hawkins today by telephone and verified that I am speaking with the correct person using two identifiers. ?Location patient: home ?Location provider: work ?Persons participating in the virtual visit: patient, provider. ?  ?I discussed the limitations, risks, security and privacy concerns of performing an evaluation and management service by telephone and the availability of in person appointments. I also discussed with the patient that there may be a patient responsible charge related to this service. The patient expressed understanding and verbally consented to this telephonic visit.  ?  ?Interactive audio and video telecommunications were attempted between this provider and patient, however failed, due to patient having technical difficulties OR patient did not have access to video capability.  We continued and completed visit with audio only. ? ?  ?Review of Systems    ? ?Cardiac Risk Factors include: advanced age (>61mn, >>38women) ? ?   ?Objective:  ?  ?Today's Vitals  ? ?There is no height or weight on file to calculate BMI. ? ?Advanced Directives 05/04/2021 04/15/2020  ?Does Patient Have a Medical Advance Directive? No No  ?Would patient like information on creating a medical advance directive? No - Patient declined Yes (MAU/Ambulatory/Procedural Areas - Information given)  ? ? ?Current Medications (verified) ?Outpatient Encounter Medications as of 05/04/2021  ?Medication Sig  ? B Complex Vitamins (VITAMIN B COMPLEX) TABS Take by mouth daily.  ? Calcium-Magnesium-Vitamin D (CALCIUM MAGNESIUM PO) Take by mouth daily.  ? Cholecalciferol 25 MCG (1000 UT) capsule Take 2,000 Units by mouth daily.  ? clobetasol (TEMOVATE) 0.05 % external solution Apply topically.  ? diltiazem (CARDIZEM CD) 120 MG 24 hr capsule   ? hydroxychloroquine (PLAQUENIL) 200 MG tablet Take by mouth  daily.  ? ibuprofen (ADVIL) 200 MG tablet Take 200 mg by mouth every 6 (six) hours as needed.  ? levothyroxine (SYNTHROID) 112 MCG tablet TAKE 1 TABLET BY MOUTH DAILY BEFORE BREAKFAST.  ? lisinopril-hydrochlorothiazide (ZESTORETIC) 20-25 MG tablet TAKE 1 TABLET BY MOUTH EVERY DAY  ? meloxicam (MOBIC) 15 MG tablet TAKE 1 TABLET (15 MG TOTAL) BY MOUTH DAILY.  ? Omega-3 Fatty Acids (FISH OIL) 1000 MG CAPS Take by mouth.  ? pantoprazole (PROTONIX) 40 MG tablet Take 1 tablet (40 mg total) by mouth daily.  ? pravastatin (PRAVACHOL) 80 MG tablet TAKE 1 TABLET BY MOUTH EVERY DAY  ? TIADYLT ER 120 MG 24 hr capsule TAKE 1 CAPSULE BY MOUTH EVERY DAY  ? traZODone (DESYREL) 100 MG tablet TAKE 1 TABLET BY MOUTH EVERYDAY AT BEDTIME  ? ?No facility-administered encounter medications on file as of 05/04/2021.  ? ? ?Allergies (verified) ?Penicillins, Bee venom, Poison ivy extract, and Poison oak extract  ? ?History: ?History reviewed. No pertinent past medical history. ?Past Surgical History:  ?Procedure Laterality Date  ? dislocated right elbow  2013  ? TREATMENT FISTULA ANAL  2020  ? WRIST FRACTURE SURGERY  2001  ? ?Family History  ?Problem Relation Age of Onset  ? Dementia Mother   ? Pancreatitis Father   ? Pancreatic cancer Brother   ? ?Social History  ? ?Socioeconomic History  ? Marital status: Married  ?  Spouse name: Not on file  ? Number of children: Not on file  ? Years of education: Not on file  ? Highest education level: Not on file  ?Occupational History  ? Not on file  ?Tobacco Use  ?  Smoking status: Former  ? Smokeless tobacco: Never  ?Substance and Sexual Activity  ? Alcohol use: Yes  ?  Comment: Social  ? Drug use: Not on file  ? Sexual activity: Yes  ?  Partners: Male  ?  Birth control/protection: Post-menopausal  ?Other Topics Concern  ? Not on file  ?Social History Narrative  ? Not on file  ? ?Social Determinants of Health  ? ?Financial Resource Strain: Low Risk   ? Difficulty of Paying Living Expenses: Not hard at  all  ?Food Insecurity: No Food Insecurity  ? Worried About Charity fundraiser in the Last Year: Never true  ? Ran Out of Food in the Last Year: Never true  ?Transportation Needs: No Transportation Needs  ? Lack of Transportation (Medical): No  ? Lack of Transportation (Non-Medical): No  ?Physical Activity: Inactive  ? Days of Exercise per Week: 0 days  ? Minutes of Exercise per Session: 0 min  ?Stress: No Stress Concern Present  ? Feeling of Stress : Not at all  ?Social Connections: Moderately Integrated  ? Frequency of Communication with Friends and Family: Three times a week  ? Frequency of Social Gatherings with Friends and Family: Three times a week  ? Attends Religious Services: Never  ? Active Member of Clubs or Organizations: Yes  ? Attends Archivist Meetings: More than 4 times per year  ? Marital Status: Married  ? ? ?Tobacco Counseling ?Counseling given: Not Answered ? ? ?Clinical Intake: ? ?Pre-visit preparation completed: Yes ? ?Pain : No/denies pain ? ?  ? ?Nutritional Risks: None ?Diabetes: No ? ?How often do you need to have someone help you when you read instructions, pamphlets, or other written materials from your doctor or pharmacy?: 1 - Never ?What is the last grade level you completed in school?: masters ? ?Diabetic?no  ? ?Interpreter Needed?: No ? ?Information entered by :: C.[email or web address] ? ? ?Activities of Daily Living ?In your present state of health, do you have any difficulty performing the following activities: 05/04/2021  ?Hearing? N  ?Vision? N  ?Difficulty concentrating or making decisions? N  ?Walking or climbing stairs? N  ?Dressing or bathing? N  ?Doing errands, shopping? N  ?Preparing Food and eating ? N  ?Using the Toilet? N  ?In the past six months, have you accidently leaked urine? N  ?Do you have problems with loss of bowel control? N  ?Managing your Medications? N  ?Managing your Finances? N  ?Some recent data might be hidden  ? ? ?Patient Care Team: ?Hoyt Koch, MD as PCP - General (Internal Medicine) ? ?Indicate any recent Medical Services you may have received from other than [facility] providers in the past year (date may be approximate). ? ?   ?Assessment:  ? This is a routine wellness examination for Laura Hawkins. ? ?Hearing/Vision screen ?Vision Screening - Comments:: Annual eye exams  ? ?Dietary issues and exercise activities discussed: ?Current Exercise Habits: The patient does not participate in regular exercise at present, Exercise limited by: orthopedic condition(s) ? ? Goals Addressed   ? ?  ?  ?  ?  ? This Visit's Progress  ?  Client understands the importance of follow-up with providers by attending scheduled visits   On track  ?  I would like to lose 100 pounds this year. ?  ? ?  ? ?Depression Screen ?PHQ 2/9 Scores 05/04/2021 05/04/2021 06/25/2020 04/15/2020 12/14/2019  ?PHQ - 2 Score 0 0 1 0 0  ?PHQ-  9 Score - - 2 - -  ?  ?Fall Risk ?Fall Risk  05/04/2021 05/04/2021 04/15/2020  ?Falls in the past year? 0 0 0  ?Number falls in past yr: 0 0 0  ?Injury with Fall? 0 0 0  ?Risk for fall due to : - - No Fall Risks  ?Follow up Falls evaluation completed Falls evaluation completed -  ? ? ?FALL RISK PREVENTION PERTAINING TO THE HOME: ? ?Any stairs in or around the home? No  ?If so, are there any without handrails? No  ?Home free of loose throw rugs in walkways, pet beds, electrical cords, etc? Yes  ?Adequate lighting in your home to reduce risk of falls? Yes  ? ?ASSISTIVE DEVICES UTILIZED TO PREVENT FALLS: ? ?Life alert? No  ?Use of a cane, walker or w/c? No  ?Grab bars in the bathroom? No  ?Shower chair or bench in shower? No  ?Elevated toilet seat or a handicapped toilet? No  ? ? ?Cognitive Function: ? Normal cognitive status assessed by direct observation by this Nurse Health Advisor. No abnormalities found.   ? ?  ?  ? ?Immunizations ?Immunization History  ?Administered Date(s) Administered  ? Fluad Quad(high Dose 65+) 10/23/2020  ? Influenza Split 03/20/2013  ? Influenza, High  Dose Seasonal PF 01/01/2015, 12/05/2015, 02/25/2017, 11/11/2017, 10/16/2018  ? Influenza-Unspecified 11/14/2019  ? PFIZER(Purple Top)SARS-COV-2 Vaccination 03/15/2019, 04/05/2019, 11/14/2019, 11/27/2020

## 2021-05-04 NOTE — Patient Instructions (Signed)
Laura Hawkins , ?Thank you for taking time to come for your Medicare Wellness Visit. I appreciate your ongoing commitment to your health goals. Please review the following plan we discussed and let me know if I can assist you in the future.  ? ?Screening recommendations/referrals: ?Colonoscopy: referral 05/04/2020 ?Mammogram: 10/14/2020 ?Bone Density: 01/30/2019 ?Recommended yearly ophthalmology/optometry visit for glaucoma screening and checkup ?Recommended yearly dental visit for hygiene and checkup ? ?Vaccinations: ?Influenza vaccine: completed  ?Pneumococcal vaccine: completed  ?Tdap vaccine: due  ?Shingles vaccine: completed    ? ?Advanced directives: yes  ? ?Conditions/risks identified: none  ? ?Next appointment: none  ? ? ?Preventive Care 72 Years and Older, Female ?Preventive care refers to lifestyle choices and visits with your health care provider that can promote health and wellness. ?What does preventive care include? ?A yearly physical exam. This is also called an annual well check. ?Dental exams once or twice a year. ?Routine eye exams. Ask your health care provider how often you should have your eyes checked. ?Personal lifestyle choices, including: ?Daily care of your teeth and gums. ?Regular physical activity. ?Eating a healthy diet. ?Avoiding tobacco and drug use. ?Limiting alcohol use. ?Practicing safe sex. ?Taking low-dose aspirin every day. ?Taking vitamin and mineral supplements as recommended by your health care provider. ?What happens during an annual well check? ?The services and screenings done by your health care provider during your annual well check will depend on your age, overall health, lifestyle risk factors, and family history of disease. ?Counseling  ?Your health care provider may ask you questions about your: ?Alcohol use. ?Tobacco use. ?Drug use. ?Emotional well-being. ?Home and relationship well-being. ?Sexual activity. ?Eating habits. ?History of falls. ?Memory and ability to  understand (cognition). ?Work and work Statistician. ?Reproductive health. ?Screening  ?You may have the following tests or measurements: ?Height, weight, and BMI. ?Blood pressure. ?Lipid and cholesterol levels. These may be checked every 5 years, or more frequently if you are over 72 years old. ?Skin check. ?Lung cancer screening. You may have this screening every year starting at age 72 if you have a 30-pack-year history of smoking and currently smoke or have quit within the past 15 years. ?Fecal occult blood test (FOBT) of the stool. You may have this test every year starting at age 72. ?Flexible sigmoidoscopy or colonoscopy. You may have a sigmoidoscopy every 5 years or a colonoscopy every 10 years starting at age 72. ?Hepatitis C blood test. ?Hepatitis B blood test. ?Sexually transmitted disease (STD) testing. ?Diabetes screening. This is done by checking your blood sugar (glucose) after you have not eaten for a while (fasting). You may have this done every 1-3 years. ?Bone density scan. This is done to screen for osteoporosis. You may have this done starting at age 72. ?Mammogram. This may be done every 1-2 years. Talk to your health care provider about how often you should have regular mammograms. ?Talk with your health care provider about your test results, treatment options, and if necessary, the need for more tests. ?Vaccines  ?Your health care provider may recommend certain vaccines, such as: ?Influenza vaccine. This is recommended every year. ?Tetanus, diphtheria, and acellular pertussis (Tdap, Td) vaccine. You may need a Td booster every 10 years. ?Zoster vaccine. You may need this after age 72. ?Pneumococcal 13-valent conjugate (PCV13) vaccine. One dose is recommended after age 72. ?Pneumococcal polysaccharide (PPSV23) vaccine. One dose is recommended after age 66. ?Talk to your health care provider about which screenings and vaccines you need and how  often you need them. ?This information is not  intended to replace advice given to you by your health care provider. Make sure you discuss any questions you have with your health care provider. ?Document Released: 02/28/2015 Document Revised: 10/22/2015 Document Reviewed: 12/03/2014 ?Elsevier Interactive Patient Education ? 2017 [place]. ? ?Fall Prevention in the Home ?Falls can cause injuries. They can happen to people of all ages. There are many things you can do to make your home safe and to help prevent falls. ?What can I do on the outside of my home? ?Regularly fix the edges of walkways and driveways and fix any cracks. ?Remove anything that might make you trip as you walk through a door, such as a raised step or threshold. ?Trim any bushes or trees on the path to your home. ?Use bright outdoor lighting. ?Clear any walking paths of anything that might make someone trip, such as rocks or tools. ?Regularly check to see if handrails are loose or broken. Make sure that both sides of any steps have handrails. ?Any raised decks and porches should have guardrails on the edges. ?Have any leaves, snow, or ice cleared regularly. ?Use sand or salt on walking paths during winter. ?Clean up any spills in your garage right away. This includes oil or grease spills. ?What can I do in the bathroom? ?Use night lights. ?Install grab bars by the toilet and in the tub and shower. Do not use towel bars as grab bars. ?Use non-skid mats or decals in the tub or shower. ?If you need to sit down in the shower, use a plastic, non-slip stool. ?Keep the floor dry. Clean up any water that spills on the floor as soon as it happens. ?Remove soap buildup in the tub or shower regularly. ?Attach bath mats securely with double-sided non-slip rug tape. ?Do not have throw rugs and other things on the floor that can make you trip. ?What can I do in the bedroom? ?Use night lights. ?Make sure that you have a light by your bed that is easy to reach. ?Do not use any sheets or blankets that are  too big for your bed. They should not hang down onto the floor. ?Have a firm chair that has side arms. You can use this for support while you get dressed. ?Do not have throw rugs and other things on the floor that can make you trip. ?What can I do in the kitchen? ?Clean up any spills right away. ?Avoid walking on wet floors. ?Keep items that you use a lot in easy-to-reach places. ?If you need to reach something above you, use a strong step stool that has a grab bar. ?Keep electrical cords out of the way. ?Do not use floor polish or wax that makes floors slippery. If you must use wax, use non-skid floor wax. ?Do not have throw rugs and other things on the floor that can make you trip. ?What can I do with my stairs? ?Do not leave any items on the stairs. ?Make sure that there are handrails on both sides of the stairs and use them. Fix handrails that are broken or loose. Make sure that handrails are as long as the stairways. ?Check any carpeting to make sure that it is firmly attached to the stairs. Fix any carpet that is loose or worn. ?Avoid having throw rugs at the top or bottom of the stairs. If you do have throw rugs, attach them to the floor with carpet tape. ?Make sure that you have a  light switch at the top of the stairs and the bottom of the stairs. If you do not have them, ask someone to add them for you. ?What else can I do to help prevent falls? ?Wear shoes that: ?Do not have high heels. ?Have rubber bottoms. ?Are comfortable and fit you well. ?Are closed at the toe. Do not wear sandals. ?If you use a stepladder: ?Make sure that it is fully opened. Do not climb a closed stepladder. ?Make sure that both sides of the stepladder are locked into place. ?Ask someone to hold it for you, if possible. ?Clearly mark and make sure that you can see: ?Any grab bars or handrails. ?First and last steps. ?Where the edge of each step is. ?Use tools that help you move around (mobility aids) if they are needed. These  include: ?Canes. ?Walkers. ?Scooters. ?Crutches. ?Turn on the lights when you go into a dark area. Replace any light bulbs as soon as they burn out. ?Set up your furniture so you have a clear path. Avoid moving your fur

## 2021-05-31 ENCOUNTER — Other Ambulatory Visit: Payer: Self-pay | Admitting: Internal Medicine

## 2021-06-25 ENCOUNTER — Ambulatory Visit (INDEPENDENT_AMBULATORY_CARE_PROVIDER_SITE_OTHER): Payer: Medicare Other | Admitting: Internal Medicine

## 2021-06-25 ENCOUNTER — Encounter: Payer: Self-pay | Admitting: Internal Medicine

## 2021-06-25 ENCOUNTER — Other Ambulatory Visit: Payer: Self-pay | Admitting: Internal Medicine

## 2021-06-25 VITALS — BP 130/74 | HR 72 | Resp 18 | Ht 65.0 in | Wt 256.0 lb

## 2021-06-25 DIAGNOSIS — E782 Mixed hyperlipidemia: Secondary | ICD-10-CM

## 2021-06-25 DIAGNOSIS — R233 Spontaneous ecchymoses: Secondary | ICD-10-CM | POA: Diagnosis not present

## 2021-06-25 DIAGNOSIS — R7303 Prediabetes: Secondary | ICD-10-CM

## 2021-06-25 DIAGNOSIS — E039 Hypothyroidism, unspecified: Secondary | ICD-10-CM

## 2021-06-25 DIAGNOSIS — M5136 Other intervertebral disc degeneration, lumbar region: Secondary | ICD-10-CM

## 2021-06-25 DIAGNOSIS — I10 Essential (primary) hypertension: Secondary | ICD-10-CM | POA: Diagnosis not present

## 2021-06-25 DIAGNOSIS — M7989 Other specified soft tissue disorders: Secondary | ICD-10-CM

## 2021-06-25 LAB — COMPREHENSIVE METABOLIC PANEL
ALT: 20 U/L (ref 0–35)
AST: 23 U/L (ref 0–37)
Albumin: 4.5 g/dL (ref 3.5–5.2)
Alkaline Phosphatase: 44 U/L (ref 39–117)
BUN: 26 mg/dL — ABNORMAL HIGH (ref 6–23)
CO2: 28 mEq/L (ref 19–32)
Calcium: 9.5 mg/dL (ref 8.4–10.5)
Chloride: 103 mEq/L (ref 96–112)
Creatinine, Ser: 1.11 mg/dL (ref 0.40–1.20)
GFR: 49.86 mL/min — ABNORMAL LOW (ref >60.00)
Glucose, Bld: 98 mg/dL (ref 70–99)
Potassium: 4.3 mEq/L (ref 3.5–5.1)
Sodium: 139 mEq/L (ref 135–145)
Total Bilirubin: 0.7 mg/dL (ref 0.2–1.2)
Total Protein: 6.9 g/dL (ref 6.0–8.3)

## 2021-06-25 LAB — CBC WITH DIFFERENTIAL/PLATELET
Basophils Absolute: 0.1 10*3/uL (ref 0.0–0.1)
Basophils Relative: 0.8 % (ref 0.0–3.0)
Eosinophils Absolute: 0.2 10*3/uL (ref 0.0–0.7)
Eosinophils Relative: 2.9 % (ref 0.0–5.0)
HCT: 37.1 % (ref 36.0–46.0)
Hemoglobin: 12.7 g/dL (ref 12.0–15.0)
Lymphocytes Relative: 24.6 % (ref 12.0–46.0)
Lymphs Abs: 1.9 10*3/uL (ref 0.7–4.0)
MCHC: 34.2 g/dL (ref 30.0–36.0)
MCV: 89.7 fl (ref 78.0–100.0)
Monocytes Absolute: 0.6 10*3/uL (ref 0.1–1.0)
Monocytes Relative: 7.5 % (ref 3.0–12.0)
Neutro Abs: 4.8 10*3/uL (ref 1.4–7.7)
Neutrophils Relative %: 64.2 % (ref 43.0–77.0)
Platelets: 221 10*3/uL (ref 150.0–400.0)
RBC: 4.14 Mil/uL (ref 3.87–5.11)
RDW: 12.9 % (ref 11.5–15.5)
WBC: 7.5 10*3/uL (ref 4.0–10.5)

## 2021-06-25 LAB — LIPID PANEL
Cholesterol: 162 mg/dL (ref 0–200)
HDL: 66.2 mg/dL (ref >39.00)
LDL Cholesterol: 79 mg/dL (ref 0–99)
NonHDL: 96.25
Total CHOL/HDL Ratio: 2
Triglycerides: 84 mg/dL (ref 0.0–149.0)
VLDL: 16.8 mg/dL (ref 0.0–40.0)

## 2021-06-25 LAB — TSH: TSH: 1.01 u[IU]/mL (ref 0.35–5.50)

## 2021-06-25 LAB — HEMOGLOBIN A1C: Hgb A1c MFr Bld: 5.2 % (ref 4.6–6.5)

## 2021-06-25 MED ORDER — SEMAGLUTIDE-WEIGHT MANAGEMENT 0.25 MG/0.5ML ~~LOC~~ SOAJ: 0.2500 mg | SUBCUTANEOUS | 0 refills | Status: AC

## 2021-06-25 MED ORDER — SEMAGLUTIDE-WEIGHT MANAGEMENT 0.5 MG/0.5ML ~~LOC~~ SOAJ
0.5000 mg | SUBCUTANEOUS | 0 refills | Status: AC
Start: 2021-07-24 — End: 2021-08-21

## 2021-06-25 MED ORDER — SEMAGLUTIDE-WEIGHT MANAGEMENT 1 MG/0.5ML ~~LOC~~ SOAJ: 1.0000 mg | SUBCUTANEOUS | 0 refills | Status: DC

## 2021-06-25 MED ORDER — SEMAGLUTIDE-WEIGHT MANAGEMENT 1.7 MG/0.75ML ~~LOC~~ SOAJ: 1.7000 mg | SUBCUTANEOUS | 0 refills | Status: DC

## 2021-06-25 MED ORDER — SEMAGLUTIDE-WEIGHT MANAGEMENT 2.4 MG/0.75ML ~~LOC~~ SOAJ
2.4000 mg | SUBCUTANEOUS | 0 refills | Status: DC
Start: 2021-10-19 — End: 2021-08-25

## 2021-06-25 NOTE — Patient Instructions (Addendum)
We will check the labs and the ultrasound of the leg.  ? ?We have sent in a prescription for wegovy to start to help with weight loss. ? ? ?

## 2021-06-25 NOTE — Progress Notes (Signed)
? ?  Subjective:  ? ?Patient ID: Laura Hawkins, female    DOB: 02-20-1950, 71 y.o.   MRN: [id number] ? ?HPI ?The patient is a 71 YO female coming in for follow up.  ? ?Review of Systems  ?Constitutional:  Positive for activity change.  ?HENT: Negative.    ?Eyes: Negative.   ?Respiratory:  Negative for cough, chest tightness and shortness of breath.   ?Cardiovascular:  Negative for chest pain, palpitations and leg swelling.  ?Gastrointestinal:  Negative for abdominal distention, abdominal pain, constipation, diarrhea, nausea and vomiting.  ?Musculoskeletal:  Positive for arthralgias and myalgias.  ?Skin: Negative.   ?Neurological: Negative.   ?Hematological:  Bruises/bleeds easily.  ?Psychiatric/Behavioral: Negative.    ? ?Objective:  ?Physical Exam ?Constitutional:   ?   Appearance: She is well-developed. She is obese.  ?HENT:  ?   Head: Normocephalic and atraumatic.  ?Cardiovascular:  ?   Rate and Rhythm: Normal rate and regular rhythm.  ?Pulmonary:  ?   Effort: Pulmonary effort is normal. No respiratory distress.  ?   Breath sounds: Normal breath sounds. No wheezing or rales.  ?Abdominal:  ?   General: Bowel sounds are normal. There is no distension.  ?   Palpations: Abdomen is soft.  ?   Tenderness: There is no abdominal tenderness. There is no rebound.  ?Musculoskeletal:     ?   General: Tenderness present.  ?   Cervical back: Normal range of motion.  ?   Comments: Right leg there is bruising around the knee and some tenderness  ?Skin: ?   General: Skin is warm and dry.  ?Neurological:  ?   Mental Status: She is alert and oriented to person, place, and time.  ?   Coordination: Coordination normal.  ? ? ?Vitals:  ? 06/25/21 1041  ?BP: 130/74  ?Pulse: 72  ?Resp: 18  ?SpO2: 98%  ?Weight: 256 lb (116.1 kg)  ?Height: '5\' 5"'$  (1.651 m)  ? ? ?Assessment & Plan:  ? ?

## 2021-06-26 DIAGNOSIS — R233 Spontaneous ecchymoses: Secondary | ICD-10-CM | POA: Insufficient documentation

## 2021-06-26 DIAGNOSIS — M7989 Other specified soft tissue disorders: Secondary | ICD-10-CM | POA: Insufficient documentation

## 2021-06-26 NOTE — Assessment & Plan Note (Signed)
Checking CBC and CMP and lipid panel. BP at goal on diltiazem 120 mg daily and lisinopril/hctz 20/25 mg daily. Duplicate removed from list. Adjust as needed.  ?

## 2021-06-26 NOTE — Assessment & Plan Note (Signed)
Checking HgA1c and adjust as needed.  

## 2021-06-26 NOTE — Assessment & Plan Note (Signed)
Checking lipid panel and adjust pravastatin 80 mg daily for LDL goal <100. ?

## 2021-06-26 NOTE — Assessment & Plan Note (Signed)
Checking CBC with diff to assess easy bruising on the leg without trauma as well as [place] to check for vein abnormality. ?

## 2021-06-26 NOTE — Assessment & Plan Note (Signed)
Getting [place] for venous reflux as she is having some swelling with bruising without trauma to assess veins. ?

## 2021-06-26 NOTE — Assessment & Plan Note (Signed)
Seeing orthopedic soon and having more sciatica since a recent fall. She is taking current medications including meloxicam 15 mg daily (reminded not to take ibuprofen otc concurrent). Okay to take tylenol otc concurrent.  ?

## 2021-06-26 NOTE — Assessment & Plan Note (Signed)
Checking TSH and adjust synthroid 112 mcg daily as needed.  ?

## 2021-06-30 ENCOUNTER — Ambulatory Visit (INDEPENDENT_AMBULATORY_CARE_PROVIDER_SITE_OTHER): Payer: Medicare Other | Admitting: Podiatry

## 2021-06-30 ENCOUNTER — Encounter: Payer: Self-pay | Admitting: Podiatry

## 2021-06-30 DIAGNOSIS — M79674 Pain in right toe(s): Secondary | ICD-10-CM

## 2021-06-30 DIAGNOSIS — L608 Other nail disorders: Secondary | ICD-10-CM

## 2021-06-30 DIAGNOSIS — M79675 Pain in left toe(s): Secondary | ICD-10-CM | POA: Diagnosis not present

## 2021-06-30 DIAGNOSIS — B351 Tinea unguium: Secondary | ICD-10-CM

## 2021-06-30 NOTE — Progress Notes (Signed)
This patient returns to the office for evaluation and treatment of long thick painful nails .  This patient is unable to trim his own nails since the patient cannot reach his feet.  Patient says the nails are painful walking and wearing his shoes.  He returns for preventive foot care services.  General Appearance  Alert, conversant and in no acute stress.  Vascular  Dorsalis pedis and posterior tibial  pulses are palpable  bilaterally.  Capillary return is within normal limits  bilaterally. Temperature is within normal limits  bilaterally.  Neurologic  Senn-Weinstein monofilament wire test within normal limits  bilaterally. Muscle power within normal limits bilaterally.  Nails Thick disfigured discolored nails with subungual debris  from hallux to fifth toes bilaterally. No evidence of bacterial infection or drainage bilaterally.  Orthopedic  No limitations of motion  feet .  No crepitus or effusions noted.  No bony pathology or digital deformities noted.  Skin  normotropic skin with no porokeratosis noted bilaterally.  No signs of infections or ulcers noted.     Onychomycosis  Pain in toes right foot  Pain in toes left foot  Debridement  of nails  1-5  B/L with a nail nipper.  Nails were then filed using a dremel tool with no incidents.    RTC  3 months    Agustina Witzke DPM  

## 2021-07-08 ENCOUNTER — Encounter: Payer: Self-pay | Admitting: Gastroenterology

## 2021-07-21 ENCOUNTER — Encounter (HOSPITAL_COMMUNITY): Payer: Medicare Other

## 2021-07-26 ENCOUNTER — Other Ambulatory Visit: Payer: Self-pay | Admitting: Internal Medicine

## 2021-07-28 ENCOUNTER — Ambulatory Visit (HOSPITAL_COMMUNITY)
Admission: RE | Admit: 2021-07-28 | Discharge: 2021-07-28 | Disposition: A | Discharge status: Home / Self Care | Payer: Medicare Other | Source: Ambulatory Visit | Attending: Internal Medicine | Admitting: Internal Medicine

## 2021-07-28 DIAGNOSIS — R233 Spontaneous ecchymoses: Secondary | ICD-10-CM | POA: Diagnosis present

## 2021-07-28 DIAGNOSIS — M7989 Other specified soft tissue disorders: Secondary | ICD-10-CM | POA: Diagnosis present

## 2021-07-30 ENCOUNTER — Ambulatory Visit (AMBULATORY_SURGERY_CENTER): Payer: Self-pay

## 2021-07-30 VITALS — Ht 65.0 in | Wt 262.0 lb

## 2021-07-30 DIAGNOSIS — Z1211 Encounter for screening for malignant neoplasm of colon: Secondary | ICD-10-CM

## 2021-07-30 MED ORDER — NA SULFATE-K SULFATE-MG SULF 17.5-3.13-1.6 GM/177ML PO SOLN: 1.0000 | Freq: Once | ORAL | 0 refills | Status: AC

## 2021-07-30 NOTE — Progress Notes (Signed)
No egg or soy allergy known to patient  No issues known to pt with past sedation with any surgeries or procedures Patient denies ever being told they had issues or difficulty with intubation  No FH of Malignant Hyperthermia Pt is not on diet pills Pt is not on home 02  Pt is not on blood thinners  Pt denies issues with constipation at this time;  No A fib or A flutter NO PA's for preps discussed with pt in PV today  Discussed with pt there will be an out-of-pocket cost for prep and that varies from $0 to 70 + dollars - pt verbalized understanding  Pt instructed to use Singlecare.com or GoodRx for a price reduction on prep  Pt verified name, DOB, address and insurance (Medicare A/B and [employer]) during PV today.  Pt given instruction packet to read and not return.  Pt encouraged to call with questions or issues.  Pt has My chart, procedure instructions sent via My Chart

## 2021-08-14 ENCOUNTER — Other Ambulatory Visit: Payer: Self-pay | Admitting: Internal Medicine

## 2021-08-14 NOTE — Telephone Encounter (Signed)
She does not have diabetes so ozempic should not be approved. She was prescribed wegovy by myself which is proper medication for weight loss only.

## 2021-08-17 ENCOUNTER — Encounter: Payer: Self-pay | Admitting: Internal Medicine

## 2021-08-17 ENCOUNTER — Other Ambulatory Visit: Payer: Self-pay | Admitting: Internal Medicine

## 2021-08-25 ENCOUNTER — Encounter: Payer: Self-pay | Admitting: Gastroenterology

## 2021-08-25 ENCOUNTER — Ambulatory Visit (AMBULATORY_SURGERY_CENTER): Payer: Medicare Other | Admitting: Gastroenterology

## 2021-08-25 VITALS — BP 143/86 | HR 62 | Temp 97.1°F | Resp 14 | Ht 65.0 in | Wt 262.0 lb

## 2021-08-25 DIAGNOSIS — D125 Benign neoplasm of sigmoid colon: Secondary | ICD-10-CM

## 2021-08-25 DIAGNOSIS — Z1211 Encounter for screening for malignant neoplasm of colon: Secondary | ICD-10-CM | POA: Diagnosis not present

## 2021-08-25 DIAGNOSIS — K635 Polyp of colon: Secondary | ICD-10-CM | POA: Diagnosis not present

## 2021-08-25 MED ORDER — SODIUM CHLORIDE 0.9 % IV SOLN: 500.0000 mL | INTRAVENOUS | Status: DC

## 2021-08-25 NOTE — Progress Notes (Signed)
To pacu, VSS. Report to [email or web address].tb 

## 2021-08-25 NOTE — Progress Notes (Signed)
GASTROENTEROLOGY PROCEDURE H&P NOTE   Primary Care Physician: Hoyt Koch, MD  HPI: Laura Hawkins is a 71 y.o. female who presents for colonoscopy for screening.  Past Medical History:  Diagnosis Date   GERD (gastroesophageal reflux disease)    on meds   Hyperlipidemia    on meds   Hypertension    on meds   Sleep apnea    uses CPAP   Thyroid disease    on meds   Past Surgical History:  Procedure Laterality Date   COLONOSCOPY  2013   [place]- normal -1 polyp- 10 yr follow   dislocated right elbow Right 2013   HAMMER TOE SURGERY Right    TREATMENT FISTULA ANAL Left 2020   WISDOM TOOTH EXTRACTION     WRIST FRACTURE SURGERY Left 2001   Current Outpatient Medications  Medication Sig Dispense Refill   B Complex Vitamins (VITAMIN B COMPLEX) TABS Take by mouth daily.     Calcium-Magnesium-Vitamin D (CALCIUM MAGNESIUM PO) Take by mouth daily.     Cholecalciferol 25 MCG (1000 UT) capsule Take 2,000 Units by mouth daily.     clobetasol (TEMOVATE) 0.05 % external solution Apply topically.     diltiazem (CARDIZEM CD) 120 MG 24 hr capsule Take 120 mg by mouth daily.     hydroxychloroquine (PLAQUENIL) 200 MG tablet Take by mouth daily.     ibuprofen (ADVIL) 200 MG tablet Take 200 mg by mouth every 6 (six) hours as needed.     levothyroxine (SYNTHROID) 112 MCG tablet TAKE 1 TABLET BY MOUTH DAILY BEFORE BREAKFAST. 90 tablet 3   lisinopril-hydrochlorothiazide (ZESTORETIC) 20-25 MG tablet TAKE 1 TABLET BY MOUTH EVERY DAY 90 tablet 3   meloxicam (MOBIC) 15 MG tablet TAKE 1 TABLET (15 MG TOTAL) BY MOUTH DAILY. 90 tablet 0   Omega-3 Fatty Acids (FISH OIL) 1000 MG CAPS Take by mouth.     OZEMPIC, 0.25 OR 0.5 MG/DOSE, 2 MG/3ML SOPN Inject 0.5 mg into the skin.     pantoprazole (PROTONIX) 40 MG tablet TAKE 1 TABLET BY MOUTH EVERY DAY 90 tablet 2   pravastatin (PRAVACHOL) 80 MG tablet TAKE 1 TABLET BY MOUTH EVERY DAY 90 tablet 3   Semaglutide-Weight Management 1 MG/0.5ML SOAJ Inject  1 mg into the skin once a week for 28 days. 2 mL 0   [START ON 09/20/2021] Semaglutide-Weight Management 1.7 MG/0.75ML SOAJ Inject 1.7 mg into the skin once a week for 28 days. 3 mL 0   [START ON 10/19/2021] Semaglutide-Weight Management 2.4 MG/0.75ML SOAJ Inject 2.4 mg into the skin once a week for 28 days. 3 mL 0   TIADYLT ER 120 MG 24 hr capsule TAKE 1 CAPSULE BY MOUTH EVERY DAY 90 capsule 1   traZODone (DESYREL) 100 MG tablet TAKE 1 TABLET BY MOUTH EVERYDAY AT BEDTIME 90 tablet 1   No current facility-administered medications for this visit.    Current Outpatient Medications:    B Complex Vitamins (VITAMIN B COMPLEX) TABS, Take by mouth daily., Disp: , Rfl:    Calcium-Magnesium-Vitamin D (CALCIUM MAGNESIUM PO), Take by mouth daily., Disp: , Rfl:    Cholecalciferol 25 MCG (1000 UT) capsule, Take 2,000 Units by mouth daily., Disp: , Rfl:    clobetasol (TEMOVATE) 0.05 % external solution, Apply topically., Disp: , Rfl:    diltiazem (CARDIZEM CD) 120 MG 24 hr capsule, Take 120 mg by mouth daily., Disp: , Rfl:    hydroxychloroquine (PLAQUENIL) 200 MG tablet, Take by mouth daily., Disp: ,  Rfl:    ibuprofen (ADVIL) 200 MG tablet, Take 200 mg by mouth every 6 (six) hours as needed., Disp: , Rfl:    levothyroxine (SYNTHROID) 112 MCG tablet, TAKE 1 TABLET BY MOUTH DAILY BEFORE BREAKFAST., Disp: 90 tablet, Rfl: 3   lisinopril-hydrochlorothiazide (ZESTORETIC) 20-25 MG tablet, TAKE 1 TABLET BY MOUTH EVERY DAY, Disp: 90 tablet, Rfl: 3   meloxicam (MOBIC) 15 MG tablet, TAKE 1 TABLET (15 MG TOTAL) BY MOUTH DAILY., Disp: 90 tablet, Rfl: 0   Omega-3 Fatty Acids (FISH OIL) 1000 MG CAPS, Take by mouth., Disp: , Rfl:    OZEMPIC, 0.25 OR 0.5 MG/DOSE, 2 MG/3ML SOPN, Inject 0.5 mg into the skin., Disp: , Rfl:    pantoprazole (PROTONIX) 40 MG tablet, TAKE 1 TABLET BY MOUTH EVERY DAY, Disp: 90 tablet, Rfl: 2   pravastatin (PRAVACHOL) 80 MG tablet, TAKE 1 TABLET BY MOUTH EVERY DAY, Disp: 90 tablet, Rfl: 3    Semaglutide-Weight Management 1 MG/0.5ML SOAJ, Inject 1 mg into the skin once a week for 28 days., Disp: 2 mL, Rfl: 0   [START ON 09/20/2021] Semaglutide-Weight Management 1.7 MG/0.75ML SOAJ, Inject 1.7 mg into the skin once a week for 28 days., Disp: 3 mL, Rfl: 0   [START ON 10/19/2021] Semaglutide-Weight Management 2.4 MG/0.75ML SOAJ, Inject 2.4 mg into the skin once a week for 28 days., Disp: 3 mL, Rfl: 0   TIADYLT ER 120 MG 24 hr capsule, TAKE 1 CAPSULE BY MOUTH EVERY DAY, Disp: 90 capsule, Rfl: 1   traZODone (DESYREL) 100 MG tablet, TAKE 1 TABLET BY MOUTH EVERYDAY AT BEDTIME, Disp: 90 tablet, Rfl: 1 Allergies  Allergen Reactions   Penicillins Anaphylaxis, Shortness Of Breath and Swelling    Throat swelling   Poison Ivy Extract Rash   Poison Oak Extract Rash   Bee Venom     Bee stings, swelling and turns red    Family History  Problem Relation Age of Onset   Dementia Mother    Pancreatitis Father    Pancreatic cancer Brother    Colon cancer Neg Hx    Colon polyps Neg Hx    Esophageal cancer Neg Hx    Stomach cancer Neg Hx    Rectal cancer Neg Hx    Social History   Socioeconomic History   Marital status: Married    Spouse name: Not on file   Number of children: Not on file   Years of education: Not on file   Highest education level: Not on file  Occupational History   Not on file  Tobacco Use   Smoking status: Former   Smokeless tobacco: Never  Substance and Sexual Activity   Alcohol use: Not Currently    Alcohol/week: 0.0 - 7.0 standard drinks of alcohol    Comment: Social   Drug use: Never   Sexual activity: Yes    Partners: Male    Birth control/protection: Post-menopausal  Other Topics Concern   Not on file  Social History Narrative   Not on file   Social Determinants of Health   Financial Resource Strain: Low Risk  (05/04/2021)   Overall Financial Resource Strain (CARDIA)    Difficulty of Paying Living Expenses: Not hard at all  Food Insecurity: No Food  Insecurity (05/04/2021)   Hunger Vital Sign    Worried About Running Out of Food in the Last Year: Never true    Ran Out of Food in the Last Year: Never true  Transportation Needs: No Transportation Needs (05/04/2021)  PRAPARE - Hydrologist (Medical): No    Lack of Transportation (Non-Medical): No  Physical Activity: Inactive (05/04/2021)   Exercise Vital Sign    Days of Exercise per Week: 0 days    Minutes of Exercise per Session: 0 min  Stress: No Stress Concern Present (05/04/2021)   [place]    Feeling of Stress : Not at all  Social Connections: Moderately Integrated (05/04/2021)   Social Connection and Isolation Panel [NHANES]    Frequency of Communication with Friends and Family: Three times a week    Frequency of Social Gatherings with Friends and Family: Three times a week    Attends Religious Services: Never    Active Member of Clubs or Organizations: Yes    Attends Archivist Meetings: More than 4 times per year    Marital Status: Married  Human resources officer Violence: Not At Risk (05/04/2021)   Humiliation, Afraid, Rape, and Kick questionnaire    Fear of Current or Ex-Partner: No    Emotionally Abused: No    Physically Abused: No    Sexually Abused: No    Physical Exam: There were no vitals filed for this visit. There is no height or weight on file to calculate BMI. GEN: NAD EYE: Sclerae anicteric ENT: MMM CV: Non-tachycardic GI: Soft, NT/ND NEURO:  Alert & Oriented x 3  Lab Results: No results for input(s): "WBC", "HGB", "HCT", "PLT" in the last 72 hours. BMET No results for input(s): "NA", "K", "CL", "CO2", "GLUCOSE", "BUN", "CREATININE", "CALCIUM" in the last 72 hours. LFT No results for input(s): "PROT", "ALBUMIN", "AST", "ALT", "ALKPHOS", "BILITOT", "BILIDIR", "IBILI" in the last 72 hours. PT/INR No results for input(s): "LABPROT", "INR" in the last 72  hours.   Impression / Plan: This is a 71 y.o.female who presents for colonoscopy for screening.  The risks and benefits of endoscopic evaluation/treatment were discussed with the patient and/or family; these include but are not limited to the risk of perforation, infection, bleeding, missed lesions, lack of diagnosis, severe illness requiring hospitalization, as well as anesthesia and sedation related illnesses.  The patient's history has been reviewed, patient examined, no change in status, and deemed stable for procedure.  The patient and/or family is agreeable to proceed.    Justice Britain, MD [facility] Office # [phone]

## 2021-08-25 NOTE — Progress Notes (Signed)
Called to room to assist during endoscopic procedure.  Patient ID and intended procedure confirmed with present staff. Received instructions for my participation in the procedure from the performing physician.  

## 2021-08-25 NOTE — Op Note (Signed)
[place] Patient Name: Laura Hawkins Procedure Date: 08/25/2021 11:44 AM MRN: [id number] Endoscopist: Justice Britain , MD Age: 71 Referring MD:  Date of Birth: 22-Sep-1950 Gender: Female Account #: [id number] Procedure:                Colonoscopy Indications:              Screening for colorectal malignant neoplasm Medicines:                Monitored Anesthesia Care Procedure:                Pre-Anesthesia Assessment:                           - Prior to the procedure, a History and Physical                            was performed, and patient medications and                            allergies were reviewed. The patient's tolerance of                            previous anesthesia was also reviewed. The risks                            and benefits of the procedure and the sedation                            options and risks were discussed with the patient.                            All questions were answered, and informed consent                            was obtained. Prior Anticoagulants: The patient has                            taken no previous anticoagulant or antiplatelet                            agents except for NSAID medication. ASA Grade                            Assessment: III - A patient with severe systemic                            disease. After reviewing the risks and benefits,                            the patient was deemed in satisfactory condition to                            undergo the procedure.  After obtaining informed consent, the colonoscope                            was passed under direct vision. Throughout the                            procedure, the patient's blood pressure, pulse, and                            oxygen saturations were monitored continuously. The                            Olympus CF-HQ190L [id number]) Colonoscope was                            introduced through the anus and  advanced to the 5                            cm into the ileum. The colonoscopy was performed                            without difficulty. The patient tolerated the                            procedure. The quality of the bowel preparation was                            adequate. The terminal ileum, ileocecal valve,                            appendiceal orifice, and rectum were photographed. Scope In: 12:00:59 PM Scope Out: 12:12:06 PM Scope Withdrawal Time: 0 hours 7 minutes 32 seconds  Total Procedure Duration: 0 hours 11 minutes 7 seconds  Findings:                 The digital rectal exam findings include                            hemorrhoids. Pertinent negatives include no                            palpable rectal lesions.                           The terminal ileum and ileocecal valve appeared                            normal.                           A 6 mm polyp was found in the descending colon. The                            polyp was sessile. The polyp was removed with a  cold snare. Resection and retrieval were complete.                           Many small and large-mouthed diverticula were found                            in the entire colon.                           Normal mucosa was found in the entire colon                            otherwise.                           Non-bleeding non-thrombosed external and internal                            hemorrhoids were found during retroflexion, during                            perianal exam and during digital exam. The                            hemorrhoids were Grade III (internal hemorrhoids                            that prolapse but require manual reduction). Complications:            No immediate complications. Estimated Blood Loss:     Estimated blood loss was minimal. Impression:               - Hemorrhoids found on digital rectal exam.                           - The examined  portion of the ileum was normal.                           - One 6 mm polyp in the descending colon, removed                            with a cold snare. Resected and retrieved.                           - Diverticulosis in the entire examined colon.                           - Normal mucosa in the entire examined colon                            otherwise.                           - Non-bleeding non-thrombosed external and internal  hemorrhoids. Recommendation:           - The patient will be observed post-procedure,                            until all discharge criteria are met.                           - Discharge patient to home.                           - Patient has a contact number available for                            emergencies. The signs and symptoms of potential                            delayed complications were discussed with the                            patient. Return to normal activities tomorrow.                            Written discharge instructions were provided to the                            patient.                           - High fiber diet.                           - Use FiberCon 1-2 tablets PO daily.                           - Continue present medications.                           - If patient wants Anusol suppositories can                            consider use (three-four nights in a row if                            significant issues of bleeding, not controlled with                            Preparation H or other OTC medications).                           - Await pathology results.                           - Repeat colonoscopy in 5-10 years for surveillance                            based on  pathology results and patient's medical                            comorbidities at the time.                           - The findings and recommendations were discussed                            with the patient.                            - The findings and recommendations were discussed                            with the designated responsible adult. Justice Britain, MD 08/25/2021 12:16:52 PM

## 2021-08-25 NOTE — Patient Instructions (Signed)
Handouts on polyps, diverticulosis, and high fiber diet given to you today  Fibercon 1-2 tablets daily recommended to keep bowel movements regular Use preparation H   YOU HAD AN ENDOSCOPIC PROCEDURE TODAY AT [place]:   Refer to the procedure report that was given to you for any specific questions about what was found during the examination.  If the procedure report does not answer your questions, please call your gastroenterologist to clarify.  If you requested that your care partner not be given the details of your procedure findings, then the procedure report has been included in a sealed envelope for you to review at your convenience later.  YOU SHOULD EXPECT: Some feelings of bloating in the abdomen. Passage of more gas than usual.  Walking can help get rid of the air that was put into your GI tract during the procedure and reduce the bloating. If you had a lower endoscopy (such as a colonoscopy or flexible sigmoidoscopy) you may notice spotting of blood in your stool or on the toilet paper. If you underwent a bowel prep for your procedure, you may not have a normal bowel movement for a few days.  Please Note:  You might notice some irritation and congestion in your nose or some drainage.  This is from the oxygen used during your procedure.  There is no need for concern and it should clear up in a day or so.  SYMPTOMS TO REPORT IMMEDIATELY:  Following lower endoscopy (colonoscopy or flexible sigmoidoscopy):  Excessive amounts of blood in the stool  Significant tenderness or worsening of abdominal pains  Swelling of the abdomen that is new, acute  Fever of 100F or higher  For urgent or emergent issues, a gastroenterologist can be reached at any hour by calling [phone]. Do not use MyChart messaging for urgent concerns.    DIET:  We do recommend a small meal at first, but then you may proceed to your regular diet.  Drink plenty of fluids but you should avoid  alcoholic beverages for 24 hours.  ACTIVITY:  You should plan to take it easy for the rest of today and you should NOT DRIVE or use heavy machinery until tomorrow (because of the sedation medicines used during the test).    FOLLOW UP: Our staff will call the number listed on your records the next business day following your procedure.  We will call around 7:15- 8:00 am to check on you and address any questions or concerns that you may have regarding the information given to you following your procedure. If we do not reach you, we will leave a message.  If you develop any symptoms (ie: fever, flu-like symptoms, shortness of breath, cough etc.) before then, please call [phone].  If you test positive for Covid 19 in the 2 weeks post procedure, please call and report this information to [place].    If any biopsies were taken you will be contacted by phone or by letter within the next 1-3 weeks.  Please call us at [phone] if you have not heard about the biopsies in 3 weeks.    SIGNATURES/CONFIDENTIALITY: You and/or your care partner have signed paperwork which will be entered into your electronic medical record.  These signatures attest to the fact that that the information above on your After Visit Summary has been reviewed and is understood.  Full responsibility of the confidentiality of this discharge information lies with you and/or your care-partner.

## 2021-08-26 ENCOUNTER — Telehealth: Payer: Self-pay | Admitting: *Deleted

## 2021-08-26 NOTE — Telephone Encounter (Signed)
  Follow up Call-     08/25/2021   10:50 AM  Call back number  Post procedure Call Back phone  # [phone]  Permission to leave phone message Yes     Patient questions:  Do you have a fever, pain , or abdominal swelling? No. Pain Score  0 *  Have you tolerated food without any problems? Yes.    Have you been able to return to your normal activities? Yes.    Do you have any questions about your discharge instructions: Diet   No. Medications  No. Follow up visit  No.  Do you have questions or concerns about your Care? No.  Actions: * If pain score is 4 or above: No action needed, pain <4.

## 2021-08-27 ENCOUNTER — Encounter: Payer: Self-pay | Admitting: Gastroenterology

## 2021-08-31 ENCOUNTER — Other Ambulatory Visit: Payer: Self-pay | Admitting: Internal Medicine

## 2021-08-31 DIAGNOSIS — Z1231 Encounter for screening mammogram for malignant neoplasm of breast: Secondary | ICD-10-CM

## 2021-08-31 MED ORDER — SAXENDA 18 MG/3ML ~~LOC~~ SOPN: PEN_INJECTOR | SUBCUTANEOUS | 0 refills | Status: AC

## 2021-08-31 MED ORDER — SAXENDA 18 MG/3ML ~~LOC~~ SOPN: 3.0000 mg | PEN_INJECTOR | Freq: Every day | SUBCUTANEOUS | 5 refills | Status: DC

## 2021-09-08 ENCOUNTER — Other Ambulatory Visit: Payer: Self-pay | Admitting: Internal Medicine

## 2021-09-11 ENCOUNTER — Ambulatory Visit: Payer: Medicare Other | Admitting: Podiatry

## 2021-09-18 ENCOUNTER — Ambulatory Visit (INDEPENDENT_AMBULATORY_CARE_PROVIDER_SITE_OTHER): Payer: Medicare Other | Admitting: Podiatry

## 2021-09-18 ENCOUNTER — Encounter: Payer: Self-pay | Admitting: Podiatry

## 2021-09-18 DIAGNOSIS — M79675 Pain in left toe(s): Secondary | ICD-10-CM

## 2021-09-18 DIAGNOSIS — M79674 Pain in right toe(s): Secondary | ICD-10-CM

## 2021-09-18 DIAGNOSIS — B351 Tinea unguium: Secondary | ICD-10-CM

## 2021-09-18 DIAGNOSIS — L608 Other nail disorders: Secondary | ICD-10-CM | POA: Diagnosis not present

## 2021-09-18 NOTE — Progress Notes (Signed)
This patient returns to the office for evaluation and treatment of long thick painful nails .  This patient is unable to trim his own nails since the patient cannot reach his feet.  Patient says the nails are painful walking and wearing his shoes.  He returns for preventive foot care services.  General Appearance  Alert, conversant and in no acute stress.  Vascular  Dorsalis pedis and posterior tibial  pulses are palpable  bilaterally.  Capillary return is within normal limits  bilaterally. Temperature is within normal limits  bilaterally.  Neurologic  Senn-Weinstein monofilament wire test within normal limits  bilaterally. Muscle power within normal limits bilaterally.  Nails Thick disfigured discolored nails with subungual debris  from hallux to fifth toes bilaterally. No evidence of bacterial infection or drainage bilaterally.  Orthopedic  No limitations of motion  feet .  No crepitus or effusions noted.  No bony pathology or digital deformities noted.  Skin  normotropic skin with no porokeratosis noted bilaterally.  No signs of infections or ulcers noted.     Onychomycosis  Pain in toes right foot  Pain in toes left foot  Debridement  of nails  1-5  B/L with a nail nipper.  Nails were then filed using a dremel tool with no incidents.    RTC  3 months    Ger Nicks DPM  

## 2021-10-24 ENCOUNTER — Other Ambulatory Visit: Payer: Self-pay | Admitting: Internal Medicine

## 2021-10-29 ENCOUNTER — Ambulatory Visit: Payer: Medicare Other

## 2021-11-25 ENCOUNTER — Ambulatory Visit
Admission: RE | Admit: 2021-11-25 | Discharge: 2021-11-25 | Disposition: A | Discharge status: Home / Self Care | Payer: Medicare Other | Source: Ambulatory Visit | Attending: Internal Medicine | Admitting: Internal Medicine

## 2021-11-25 DIAGNOSIS — Z1231 Encounter for screening mammogram for malignant neoplasm of breast: Secondary | ICD-10-CM

## 2021-12-04 ENCOUNTER — Encounter: Payer: Self-pay | Admitting: Podiatry

## 2021-12-04 ENCOUNTER — Ambulatory Visit (INDEPENDENT_AMBULATORY_CARE_PROVIDER_SITE_OTHER): Payer: Medicare Other | Admitting: Podiatry

## 2021-12-04 DIAGNOSIS — M79675 Pain in left toe(s): Secondary | ICD-10-CM

## 2021-12-04 DIAGNOSIS — B351 Tinea unguium: Secondary | ICD-10-CM | POA: Diagnosis not present

## 2021-12-04 DIAGNOSIS — L608 Other nail disorders: Secondary | ICD-10-CM

## 2021-12-04 DIAGNOSIS — M79674 Pain in right toe(s): Secondary | ICD-10-CM | POA: Diagnosis not present

## 2021-12-04 NOTE — Progress Notes (Signed)
This patient returns to the office for evaluation and treatment of long thick painful nails .  This patient is unable to trim his own nails since the patient cannot reach his feet.  Patient says the nails are painful walking and wearing his shoes.  He returns for preventive foot care services.  General Appearance  Alert, conversant and in no acute stress.  Vascular  Dorsalis pedis and posterior tibial  pulses are palpable  bilaterally.  Capillary return is within normal limits  bilaterally. Temperature is within normal limits  bilaterally.  Neurologic  Senn-Weinstein monofilament wire test within normal limits  bilaterally. Muscle power within normal limits bilaterally.  Nails Thick disfigured discolored nails with subungual debris  from hallux to fifth toes bilaterally. No evidence of bacterial infection or drainage bilaterally.  Orthopedic  No limitations of motion  feet .  No crepitus or effusions noted.  No bony pathology or digital deformities noted.  Skin  normotropic skin with no porokeratosis noted bilaterally.  No signs of infections or ulcers noted.     Onychomycosis  Pain in toes right foot  Pain in toes left foot  Debridement  of nails  1-5  B/L with a nail nipper.  Nails were then filed using a dremel tool with no incidents.    RTC  10 weeks    Myishia Kasik DPM  

## 2021-12-24 ENCOUNTER — Ambulatory Visit (INDEPENDENT_AMBULATORY_CARE_PROVIDER_SITE_OTHER): Payer: Medicare Other | Admitting: Internal Medicine

## 2021-12-24 VITALS — BP 160/80 | HR 86 | Temp 98.1°F | Ht 65.0 in | Wt 256.1 lb

## 2021-12-24 DIAGNOSIS — Z23 Encounter for immunization: Secondary | ICD-10-CM

## 2021-12-24 NOTE — Progress Notes (Signed)
   Subjective:   Patient ID: Laura Hawkins, female    DOB: [birth year], 71 y.o.   MRN: [id number]  HPI The patient is a 71 YO female coming in for follow up.  Review of Systems  Constitutional:  Positive for unexpected weight change.  HENT: Negative.    Eyes: Negative.   Respiratory:  Negative for cough, chest tightness and shortness of breath.   Cardiovascular:  Negative for chest pain, palpitations and leg swelling.  Gastrointestinal:  Negative for abdominal distention, abdominal pain, constipation, diarrhea, nausea and vomiting.  Musculoskeletal: Negative.   Skin: Negative.   Neurological: Negative.   Psychiatric/Behavioral: Negative.      Objective:  Physical Exam Constitutional:      Appearance: She is well-developed.  HENT:     Head: Normocephalic and atraumatic.  Cardiovascular:     Rate and Rhythm: Normal rate and regular rhythm.  Pulmonary:     Effort: Pulmonary effort is normal. No respiratory distress.     Breath sounds: Normal breath sounds. No wheezing or rales.  Abdominal:     General: Bowel sounds are normal. There is no distension.     Palpations: Abdomen is soft.     Tenderness: There is no abdominal tenderness. There is no rebound.  Musculoskeletal:     Cervical back: Normal range of motion.  Skin:    General: Skin is warm and dry.  Neurological:     Mental Status: She is alert and oriented to person, place, and time.     Coordination: Coordination normal.     Vitals:   12/24/21 1043 12/24/21 1047  BP: (!) 160/80 (!) 160/80  Pulse: 86   Temp: 98.1 F (36.7 C)   TempSrc: Oral   SpO2: 96%   Weight: 256 lb 2 oz (116.2 kg)   Height: '5\' 5"'$  (1.651 m)     Assessment & Plan:  Flu shot given at visit

## 2021-12-24 NOTE — Patient Instructions (Signed)
We will prescribe the zepbound for you to take 2.5 mg weekly for 1 month, then 5 mg weekly for 1 month then let us know how you are doing.

## 2021-12-25 ENCOUNTER — Encounter: Payer: Self-pay | Admitting: Internal Medicine

## 2021-12-25 NOTE — Assessment & Plan Note (Signed)
Has tried and struggled with losing weight with conventional means. Insurance did not cover wegovy or saxenda. She requests rx for zepbound (newly approved). She is given physical prescription for months 1 and 2 (2.5 mg weekly and 5 mg weekly respectively) and will assess results at that time if she is able to obtain. Discussed risk of GI side effects.

## 2022-01-16 ENCOUNTER — Other Ambulatory Visit: Payer: Self-pay | Admitting: Internal Medicine

## 2022-01-28 ENCOUNTER — Other Ambulatory Visit: Payer: Self-pay

## 2022-01-28 ENCOUNTER — Encounter: Payer: Self-pay | Admitting: Internal Medicine

## 2022-01-28 MED ORDER — PRAVASTATIN SODIUM 80 MG PO TABS: 80.0000 mg | ORAL_TABLET | Freq: Every day | ORAL | 3 refills | Status: DC

## 2022-01-29 ENCOUNTER — Telehealth: Payer: Medicare Other | Admitting: Family Medicine

## 2022-01-29 DIAGNOSIS — J40 Bronchitis, not specified as acute or chronic: Secondary | ICD-10-CM | POA: Diagnosis not present

## 2022-01-29 MED ORDER — BENZONATATE 200 MG PO CAPS: 200.0000 mg | ORAL_CAPSULE | Freq: Two times a day (BID) | ORAL | 0 refills | Status: AC | PRN

## 2022-01-29 MED ORDER — AZITHROMYCIN 250 MG PO TABS: ORAL_TABLET | ORAL | 0 refills | Status: AC

## 2022-01-29 NOTE — Telephone Encounter (Signed)
Patient called in and I made her aware of message

## 2022-01-29 NOTE — Progress Notes (Signed)

## 2022-02-01 MED ORDER — PROMETHAZINE-DM 6.25-15 MG/5ML PO SYRP: 5.0000 mL | ORAL_SOLUTION | Freq: Four times a day (QID) | ORAL | 0 refills | Status: DC | PRN

## 2022-02-10 ENCOUNTER — Other Ambulatory Visit: Payer: Self-pay | Admitting: Internal Medicine

## 2022-02-12 ENCOUNTER — Encounter: Payer: Self-pay | Admitting: Podiatry

## 2022-02-12 ENCOUNTER — Ambulatory Visit (INDEPENDENT_AMBULATORY_CARE_PROVIDER_SITE_OTHER): Payer: Medicare Other | Admitting: Podiatry

## 2022-02-12 DIAGNOSIS — B351 Tinea unguium: Secondary | ICD-10-CM | POA: Diagnosis not present

## 2022-02-12 DIAGNOSIS — M79675 Pain in left toe(s): Secondary | ICD-10-CM

## 2022-02-12 DIAGNOSIS — L608 Other nail disorders: Secondary | ICD-10-CM

## 2022-02-12 DIAGNOSIS — M79674 Pain in right toe(s): Secondary | ICD-10-CM | POA: Diagnosis not present

## 2022-02-12 MED ORDER — PROMETHAZINE-DM 6.25-15 MG/5ML PO SYRP: 5.0000 mL | ORAL_SOLUTION | Freq: Four times a day (QID) | ORAL | 0 refills | Status: DC | PRN

## 2022-02-12 NOTE — Progress Notes (Signed)
This patient returns to the office for evaluation and treatment of long thick painful nails .  This patient is unable to trim his own nails since the patient cannot reach his feet.  Patient says the nails are painful walking and wearing his shoes.  He returns for preventive foot care services.  General Appearance  Alert, conversant and in no acute stress.  Vascular  Dorsalis pedis and posterior tibial  pulses are palpable  bilaterally.  Capillary return is within normal limits  bilaterally. Temperature is within normal limits  bilaterally.  Neurologic  Senn-Weinstein monofilament wire test within normal limits  bilaterally. Muscle power within normal limits bilaterally.  Nails Thick disfigured discolored nails with subungual debris  from hallux to fifth toes bilaterally. No evidence of bacterial infection or drainage bilaterally.  Orthopedic  No limitations of motion  feet .  No crepitus or effusions noted.  No bony pathology or digital deformities noted.  Skin  normotropic skin with no porokeratosis noted bilaterally.  No signs of infections or ulcers noted.     Onychomycosis  Pain in toes right foot  Pain in toes left foot  Debridement  of nails  1-5  B/L with a nail nipper.  Nails were then filed using a dremel tool with no incidents.    RTC  10 weeks    Gardiner Barefoot DPM

## 2022-03-18 ENCOUNTER — Other Ambulatory Visit: Payer: Self-pay | Admitting: Internal Medicine

## 2022-03-18 ENCOUNTER — Encounter: Payer: Self-pay | Admitting: Internal Medicine

## 2022-03-19 MED ORDER — PROMETHAZINE-DM 6.25-15 MG/5ML PO SYRP: 5.0000 mL | ORAL_SOLUTION | Freq: Four times a day (QID) | ORAL | 0 refills | Status: DC | PRN

## 2022-04-23 ENCOUNTER — Ambulatory Visit (INDEPENDENT_AMBULATORY_CARE_PROVIDER_SITE_OTHER): Payer: Medicare Other | Admitting: Podiatry

## 2022-04-23 ENCOUNTER — Encounter: Payer: Self-pay | Admitting: Podiatry

## 2022-04-23 DIAGNOSIS — B351 Tinea unguium: Secondary | ICD-10-CM

## 2022-04-23 DIAGNOSIS — L608 Other nail disorders: Secondary | ICD-10-CM

## 2022-04-23 DIAGNOSIS — M79674 Pain in right toe(s): Secondary | ICD-10-CM

## 2022-04-23 DIAGNOSIS — M79675 Pain in left toe(s): Secondary | ICD-10-CM

## 2022-04-23 NOTE — Progress Notes (Signed)
This patient returns to the office for evaluation and treatment of long thick painful nails .  This patient is unable to trim his own nails since the patient cannot reach his feet.  Patient says the nails are painful walking and wearing his shoes.  He returns for preventive foot care services.  General Appearance  Alert, conversant and in no acute stress.  Vascular  Dorsalis pedis and posterior tibial  pulses are palpable  bilaterally.  Capillary return is within normal limits  bilaterally. Temperature is within normal limits  bilaterally.  Neurologic  Senn-Weinstein monofilament wire test within normal limits  bilaterally. Muscle power within normal limits bilaterally.  Nails Thick disfigured discolored nails with subungual debris  from hallux to fifth toes bilaterally. No evidence of bacterial infection or drainage bilaterally.  Orthopedic  No limitations of motion  feet .  No crepitus or effusions noted.  No bony pathology or digital deformities noted.  Skin  normotropic skin with no porokeratosis noted bilaterally.  No signs of infections or ulcers noted.     Onychomycosis  Pain in toes right foot  Pain in toes left foot  Debridement  of nails  1-5  B/L with a nail nipper.  Nails were then filed using a dremel tool with no incidents.    RTC  10 weeks    Kynzlee Hucker DPM  

## 2022-05-07 ENCOUNTER — Ambulatory Visit (INDEPENDENT_AMBULATORY_CARE_PROVIDER_SITE_OTHER): Payer: Medicare Other

## 2022-05-07 VITALS — Ht 65.0 in | Wt 256.0 lb

## 2022-05-07 DIAGNOSIS — Z0000 Encounter for general adult medical examination without abnormal findings: Secondary | ICD-10-CM | POA: Diagnosis not present

## 2022-05-07 NOTE — Progress Notes (Signed)
Subjective:   Laura Hawkins is a 72 y.o. female who presents for Medicare Annual (Subsequent) preventive examination.  I connected with  Laura Hawkins on 05/07/22 by a audio enabled telemedicine application and verified that I am speaking with the correct person using two identifiers.  Patient Location: Home  Provider Location: Office/Clinic  I discussed the limitations of evaluation and management by telemedicine. The patient expressed understanding and agreed to proceed.  Review of Systems     Cardiac Risk Factors include: advanced age (>15men, >44 women);hypertension;sedentary lifestyle     Objective:    Today's Vitals   05/07/22 1122  Weight: 256 lb (116.1 kg)  Height: 5\' 5"  (1.651 m)   Body mass index is 42.6 kg/m.     05/07/2022   11:29 AM 05/04/2021   10:33 AM 04/15/2020    9:42 AM  Advanced Directives  Does Patient Have a Medical Advance Directive? No No No  Would patient like information on creating a medical advance directive? No - Patient declined No - Patient declined Yes (MAU/Ambulatory/Procedural Areas - Information given)    Current Medications (verified) Outpatient Encounter Medications as of 05/07/2022  Medication Sig   B Complex Vitamins (VITAMIN B COMPLEX) TABS Take by mouth daily.   Calcium-Magnesium-Vitamin D (CALCIUM MAGNESIUM PO) Take by mouth daily.   Cholecalciferol 25 MCG (1000 UT) capsule Take 2,000 Units by mouth daily.   clobetasol (TEMOVATE) 0.05 % external solution Apply topically.   diltiazem (CARDIZEM CD) 120 MG 24 hr capsule Take 120 mg by mouth daily.   diltiazem (TIADYLT ER) 120 MG 24 hr capsule TAKE 1 CAPSULE BY MOUTH EVERY DAY   hydroxychloroquine (PLAQUENIL) 200 MG tablet Take 1 tablet by mouth 2 (two) times daily.   ibuprofen (ADVIL) 200 MG tablet Take 200 mg by mouth every 6 (six) hours as needed.   levothyroxine (SYNTHROID) 112 MCG tablet TAKE 1 TABLET BY MOUTH EVERY DAY BEFORE BREAKFAST   lisinopril-hydrochlorothiazide (ZESTORETIC)  20-25 MG tablet TAKE 1 TABLET BY MOUTH EVERY DAY   meloxicam (MOBIC) 15 MG tablet TAKE 1 TABLET (15 MG TOTAL) BY MOUTH DAILY.   Omega-3 Fatty Acids (FISH OIL) 1000 MG CAPS Take by mouth.   pantoprazole (PROTONIX) 40 MG tablet TAKE 1 TABLET BY MOUTH EVERY DAY   pravastatin (PRAVACHOL) 80 MG tablet Take 1 tablet (80 mg total) by mouth daily.   promethazine-dextromethorphan (PROMETHAZINE-DM) 6.25-15 MG/5ML syrup Take 5 mLs by mouth 4 (four) times daily as needed.   Semaglutide,0.25 or 0.5MG /DOS, (OZEMPIC, 0.25 OR 0.5 MG/DOSE,) 2 MG/3ML SOPN INJECT 0.25 MG INTO THE SKIN ONCE A WEEK FOR 28 DAYS   traZODone (DESYREL) 100 MG tablet TAKE 1 TABLET BY MOUTH EVERYDAY AT BEDTIME   [DISCONTINUED] Liraglutide -Weight Management (SAXENDA) 18 MG/3ML SOPN Inject 3 mg into the skin daily.   No facility-administered encounter medications on file as of 05/07/2022.    Allergies (verified) Penicillins, Poison ivy extract, Poison oak extract, and Bee venom   History: Past Medical History:  Diagnosis Date   GERD (gastroesophageal reflux disease)    on meds   Hyperlipidemia    on meds   Hypertension    on meds   Sleep apnea    uses CPAP   Thyroid disease    on meds   Past Surgical History:  Procedure Laterality Date   COLONOSCOPY  2013   [place]- normal -1 polyp- 10 yr follow   dislocated right elbow Right 2013   HAMMER TOE SURGERY Right    TREATMENT  FISTULA ANAL Left 2020   WISDOM TOOTH EXTRACTION     WRIST FRACTURE SURGERY Left 2001   Family History  Problem Relation Age of Onset   Dementia Mother    Pancreatitis Father    Pancreatic cancer Brother    Colon cancer Neg Hx    Colon polyps Neg Hx    Esophageal cancer Neg Hx    Stomach cancer Neg Hx    Rectal cancer Neg Hx    Social History   Socioeconomic History   Marital status: Married    Spouse name: Not on file   Number of children: Not on file   Years of education: Not on file   Highest education level: Not on file  Occupational  History   Not on file  Tobacco Use   Smoking status: Former   Smokeless tobacco: Never  Substance and Sexual Activity   Alcohol use: Not Currently    Alcohol/week: 0.0 - 7.0 standard drinks of alcohol    Comment: Social   Drug use: Never   Sexual activity: Yes    Partners: Male    Birth control/protection: Post-menopausal  Other Topics Concern   Not on file  Social History Narrative   Not on file   Social Determinants of Health   Financial Resource Strain: Low Risk  (05/07/2022)   Overall Financial Resource Strain (CARDIA)    Difficulty of Paying Living Expenses: Not hard at all  Food Insecurity: No Food Insecurity (05/07/2022)   Hunger Vital Sign    Worried About Running Out of Food in the Last Year: Never true    Ran Out of Food in the Last Year: Never true  Transportation Needs: No Transportation Needs (05/07/2022)   PRAPARE - Hydrologist (Medical): No    Lack of Transportation (Non-Medical): No  Physical Activity: Inactive (05/07/2022)   Exercise Vital Sign    Days of Exercise per Week: 0 days    Minutes of Exercise per Session: 0 min  Stress: No Stress Concern Present (05/07/2022)   [place]    Feeling of Stress : Only a little  Social Connections: Moderately Integrated (05/07/2022)   Social Connection and Isolation Panel [NHANES]    Frequency of Communication with Friends and Family: Three times a week    Frequency of Social Gatherings with Friends and Family: Twice a week    Attends Religious Services: Never    Marine scientist or Organizations: Yes    Attends Music therapist: More than 4 times per year    Marital Status: Married    Tobacco Counseling Counseling given: Not Answered   Clinical Intake:  Pre-visit preparation completed: Yes  Pain : No/denies pain  Diabetes: No  How often do you need to have someone help you when you read  instructions, pamphlets, or other written materials from your doctor or pharmacy?: 1 - Never  Diabetic?No   Interpreter Needed?: No  Information entered by :: Denman George LPN   Activities of Daily Living    05/07/2022   11:29 AM 05/03/2022   11:07 AM  In your present state of health, do you have any difficulty performing the following activities:  Hearing? 1 1  Vision? 0 0  Difficulty concentrating or making decisions? 0 0  Walking or climbing stairs? 1 1  Dressing or bathing? 0 0  Doing errands, shopping? 0 0  Preparing Food and eating ? N N  Using the Toilet? N N  In the past six months, have you accidently leaked urine? N N  Do you have problems with loss of bowel control? Y Y  Managing your Medications? N N  Managing your Finances? N N  Housekeeping or managing your Housekeeping? N N    Patient Care Team: Hoyt Koch, MD as PCP - General (Internal Medicine) Delsa Sale, [place] (Optometry) Gardiner Barefoot, DPM as Consulting Physician (Podiatry) Harriett Sine, MD as Consulting Physician (Dermatology)  Indicate any recent Medical Services you may have received from other than [facility] providers in the past year (date may be approximate).     Assessment:   This is a routine wellness examination for Laura Hawkins.  Hearing/Vision screen Hearing Screening - Comments:: Hard of hearing  Vision Screening - Comments:: Wears rx glasses - up to date with routine eye exams with Dr. Sabra Heck    Dietary issues and exercise activities discussed: Current Exercise Habits: The patient does not participate in regular exercise at present   Goals Addressed             This Visit's Progress    COMPLETED: Client understands the importance of follow-up with providers by attending scheduled visits       I would like to lose 100 pounds this year.     Increase physical activity        Depression Screen    05/07/2022   11:28 AM 12/24/2021   10:49 AM 06/25/2021   10:47 AM  05/04/2021   10:34 AM 05/04/2021   10:32 AM 06/25/2020   11:54 AM 04/15/2020    9:41 AM  PHQ 2/9 Scores  PHQ - 2 Score 0 0 2 0 0 1 0  PHQ- 9 Score  0 4   2     Fall Risk    05/07/2022   11:26 AM 05/03/2022   11:07 AM 12/24/2021   10:48 AM 06/25/2021   10:47 AM 05/04/2021   10:35 AM  Fall Risk   Falls in the past year? 1 1 0 1 0  Number falls in past yr: 0 0 0 0 0  Injury with Fall? 0  0 1 0  Risk for fall due to : History of fall(s)      Follow up Falls evaluation completed;Falls prevention discussed;Education provided  Falls evaluation completed  Falls evaluation completed    FALL RISK PREVENTION PERTAINING TO THE HOME:  Any stairs in or around the home? No  If so, are there any without handrails? No  Home free of loose throw rugs in walkways, pet beds, electrical cords, etc? Yes  Adequate lighting in your home to reduce risk of falls? Yes   ASSISTIVE DEVICES UTILIZED TO PREVENT FALLS:  Life alert? No  Use of a cane, walker or w/c? No  Grab bars in the bathroom? Yes  Shower chair or bench in shower? No  Elevated toilet seat or a handicapped toilet? Yes   TIMED UP AND GO:  Was the test performed? No . Telephonic visit    Cognitive Function:        05/07/2022   11:30 AM  6CIT Screen  What Year? 0 points  What month? 0 points  What time? 0 points  Count back from 20 0 points  Months in reverse 0 points  Repeat phrase 0 points  Total Score 0 points    Immunizations Immunization History  Administered Date(s) Administered   Fluad Quad(high Dose 65+) 10/23/2020   Influenza Split  03/20/2013   Influenza, High Dose Seasonal PF 01/01/2015, 12/05/2015, 02/25/2017, 11/11/2017, 10/16/2018   Influenza,inj,Quad PF,6+ Mos 12/24/2021   Influenza-Unspecified 11/14/2019   PFIZER Comirnaty(Gray Top)Covid-19 Tri-Sucrose Vaccine 12/10/2021   PFIZER(Purple Top)SARS-COV-2 Vaccination 03/15/2019, 04/05/2019, 11/14/2019, 11/27/2020   Pneumococcal Conjugate-13 09/29/2015    Pneumococcal Polysaccharide-23 09/20/2014   Tdap 11/28/2008   Zoster Recombinat (Shingrix) 05/12/2017, 07/16/2017    TDAP status: Due, Education has been provided regarding the importance of this vaccine. Advised may receive this vaccine at local pharmacy or Health Dept. Aware to provide a copy of the vaccination record if obtained from local pharmacy or Health Dept. Verbalized acceptance and understanding.  Flu Vaccine status: Up to date  Pneumococcal vaccine status: Up to date  Covid-19 vaccine status: Information provided on how to obtain vaccines.   Qualifies for Shingles Vaccine? Yes   Zostavax completed No   Shingrix Completed?: Yes  Screening Tests Health Maintenance  Topic Date Due   Hepatitis C Screening  Never done   DTaP/Tdap/Td (2 - Td or Tdap) 11/29/2018   COVID-19 Vaccine (6 - 2023-24 season) 02/04/2022   MAMMOGRAM  11/26/2022   Medicare Annual Wellness ([place])  05/07/2023   COLONOSCOPY (Pts 45-6yrs Insurance coverage will need to be confirmed)  08/26/2031   Pneumonia Vaccine 55+ Years old  Completed   INFLUENZA VACCINE  Completed   DEXA SCAN  Completed   Zoster Vaccines- Shingrix  Completed   HPV VACCINES  Aged Out    Health Maintenance  Health Maintenance Due  Topic Date Due   Hepatitis C Screening  Never done   DTaP/Tdap/Td (2 - Td or Tdap) 11/29/2018   COVID-19 Vaccine (6 - 2023-24 season) 02/04/2022    Colorectal cancer screening: Type of screening: Colonoscopy. Completed 08/25/21. Repeat every 10 years  Mammogram status: Completed 11/25/21. Repeat every year  Bone density status: Completed 01/30/19  Lung Cancer Screening: (Low Dose CT Chest recommended if Age 29-80 years, 30 pack-year currently smoking OR have quit w/in 15years.) does not qualify.   Lung Cancer Screening Referral: n/a  Additional Screening:  Hepatitis C Screening: does qualify;   Vision Screening: Recommended annual ophthalmology exams for early detection of glaucoma and other  disorders of the eye. Is the patient up to date with their annual eye exam?  Yes  Who is the provider or what is the name of the office in which the patient attends annual eye exams? Dr. Sabra Heck If pt is not established with a provider, would they like to be referred to a provider to establish care? No .   Dental Screening: Recommended annual dental exams for proper oral hygiene  Community Resource Referral / Chronic Care Management: CRR required this visit?  No   CCM required this visit?  No      Plan:     I have personally reviewed and noted the following in the patient's chart:   Medical and social history Use of alcohol, tobacco or illicit drugs  Current medications and supplements including opioid prescriptions. Patient is not currently taking opioid prescriptions. Functional ability and status Nutritional status Physical activity Advanced directives List of other physicians Hospitalizations, surgeries, and ER visits in previous 12 months Vitals Screenings to include cognitive, depression, and falls Referrals and appointments  In addition, I have reviewed and discussed with patient certain preventive protocols, quality metrics, and best practice recommendations. A written personalized care plan for preventive services as well as general preventive health recommendations were provided to patient.     Vanetta Mulders,  LPN   [id number]   Due to this being a virtual visit, the after visit summary with patients personalized plan was offered to patient via mail or my-chart. Patient would like to access on my-chart/  Nurse Notes: No concerns

## 2022-05-07 NOTE — Patient Instructions (Addendum)
Laura Hawkins , Thank you for taking time to come for your Medicare Wellness Visit. I appreciate your ongoing commitment to your health goals. Please review the following plan we discussed and let me know if I can assist you in the future.   These are the goals we discussed:  Goals      Increase physical activity        This is a list of the screening recommended for you and due dates:  Health Maintenance  Topic Date Due   Hepatitis C Screening: USPSTF Recommendation to screen - Ages 10-79 yo.  Never done   DTaP/Tdap/Td vaccine (2 - Td or Tdap) 11/29/2018   COVID-19 Vaccine (6 - 2023-24 season) 02/04/2022   Mammogram  11/26/2022   Medicare Annual Wellness Visit  05/07/2023   Colon Cancer Screening  08/26/2031   Pneumonia Vaccine  Completed   Flu Shot  Completed   DEXA scan (bone density measurement)  Completed   Zoster (Shingles) Vaccine  Completed   HPV Vaccine  Aged Out    Advanced directives: Forms are available if you choose in the future to pursue completion.  This is recommended in order to make sure that your health wishes are honored in the event that you are unable to verbalize them to the provider.    Conditions/risks identified: Aim for 30 minutes of exercise or brisk walking, 6-8 glasses of water, and 5 servings of fruits and vegetables each day.   Next appointment: Follow up in one year for your annual wellness visit    Preventive Care 65 Years and Older, Female Preventive care refers to lifestyle choices and visits with your health care provider that can promote health and wellness. What does preventive care include? A yearly physical exam. This is also called an annual well check. Dental exams once or twice a year. Routine eye exams. Ask your health care provider how often you should have your eyes checked. Personal lifestyle choices, including: Daily care of your teeth and gums. Regular physical activity. Eating a healthy diet. Avoiding tobacco and drug  use. Limiting alcohol use. Practicing safe sex. Taking low-dose aspirin every day. Taking vitamin and mineral supplements as recommended by your health care provider. What happens during an annual well check? The services and screenings done by your health care provider during your annual well check will depend on your age, overall health, lifestyle risk factors, and family history of disease. Counseling  Your health care provider may ask you questions about your: Alcohol use. Tobacco use. Drug use. Emotional well-being. Home and relationship well-being. Sexual activity. Eating habits. History of falls. Memory and ability to understand (cognition). Work and work Statistician. Reproductive health. Screening  You may have the following tests or measurements: Height, weight, and BMI. Blood pressure. Lipid and cholesterol levels. These may be checked every 5 years, or more frequently if you are over 72 years old. Skin check. Lung cancer screening. You may have this screening every year starting at age 16 if you have a 30-pack-year history of smoking and currently smoke or have quit within the past 15 years. Fecal occult blood test (FOBT) of the stool. You may have this test every year starting at age 19. Flexible sigmoidoscopy or colonoscopy. You may have a sigmoidoscopy every 5 years or a colonoscopy every 10 years starting at age 70. Hepatitis C blood test. Hepatitis B blood test. Sexually transmitted disease (STD) testing. Diabetes screening. This is done by checking your blood sugar (glucose) after you have  not eaten for a while (fasting). You may have this done every 1-3 years. Bone density scan. This is done to screen for osteoporosis. You may have this done starting at age 68. Mammogram. This may be done every 1-2 years. Talk to your health care provider about how often you should have regular mammograms. Talk with your health care provider about your test results, treatment  options, and if necessary, the need for more tests. Vaccines  Your health care provider may recommend certain vaccines, such as: Influenza vaccine. This is recommended every year. Tetanus, diphtheria, and acellular pertussis (Tdap, Td) vaccine. You may need a Td booster every 10 years. Zoster vaccine. You may need this after age 75. Pneumococcal 13-valent conjugate (PCV13) vaccine. One dose is recommended after age 44. Pneumococcal polysaccharide (PPSV23) vaccine. One dose is recommended after age 40. Talk to your health care provider about which screenings and vaccines you need and how often you need them. This information is not intended to replace advice given to you by your health care provider. Make sure you discuss any questions you have with your health care provider. Document Released: 02/28/2015 Document Revised: 10/22/2015 Document Reviewed: 12/03/2014 Elsevier Interactive Patient Education  2017 [employer] Prevention in the Home Falls can cause injuries. They can happen to people of all ages. There are many things you can do to make your home safe and to help prevent falls. What can I do on the outside of my home? Regularly fix the edges of walkways and driveways and fix any cracks. Remove anything that might make you trip as you walk through a door, such as a raised step or threshold. Trim any bushes or trees on the path to your home. Use bright outdoor lighting. Clear any walking paths of anything that might make someone trip, such as rocks or tools. Regularly check to see if handrails are loose or broken. Make sure that both sides of any steps have handrails. Any raised decks and porches should have guardrails on the edges. Have any leaves, snow, or ice cleared regularly. Use sand or salt on walking paths during winter. Clean up any spills in your garage right away. This includes oil or grease spills. What can I do in the bathroom? Use night lights. Install grab  bars by the toilet and in the tub and shower. Do not use towel bars as grab bars. Use non-skid mats or decals in the tub or shower. If you need to sit down in the shower, use a plastic, non-slip stool. Keep the floor dry. Clean up any water that spills on the floor as soon as it happens. Remove soap buildup in the tub or shower regularly. Attach bath mats securely with double-sided non-slip rug tape. Do not have throw rugs and other things on the floor that can make you trip. What can I do in the bedroom? Use night lights. Make sure that you have a light by your bed that is easy to reach. Do not use any sheets or blankets that are too big for your bed. They should not hang down onto the floor. Have a firm chair that has side arms. You can use this for support while you get dressed. Do not have throw rugs and other things on the floor that can make you trip. What can I do in the kitchen? Clean up any spills right away. Avoid walking on wet floors. Keep items that you use a lot in easy-to-reach places. If you need to  reach something above you, use a strong step stool that has a grab bar. Keep electrical cords out of the way. Do not use floor polish or wax that makes floors slippery. If you must use wax, use non-skid floor wax. Do not have throw rugs and other things on the floor that can make you trip. What can I do with my stairs? Do not leave any items on the stairs. Make sure that there are handrails on both sides of the stairs and use them. Fix handrails that are broken or loose. Make sure that handrails are as long as the stairways. Check any carpeting to make sure that it is firmly attached to the stairs. Fix any carpet that is loose or worn. Avoid having throw rugs at the top or bottom of the stairs. If you do have throw rugs, attach them to the floor with carpet tape. Make sure that you have a light switch at the top of the stairs and the bottom of the stairs. If you do not have them,  ask someone to add them for you. What else can I do to help prevent falls? Wear shoes that: Do not have high heels. Have rubber bottoms. Are comfortable and fit you well. Are closed at the toe. Do not wear sandals. If you use a stepladder: Make sure that it is fully opened. Do not climb a closed stepladder. Make sure that both sides of the stepladder are locked into place. Ask someone to hold it for you, if possible. Clearly mark and make sure that you can see: Any grab bars or handrails. First and last steps. Where the edge of each step is. Use tools that help you move around (mobility aids) if they are needed. These include: Canes. Walkers. Scooters. Crutches. Turn on the lights when you go into a dark area. Replace any light bulbs as soon as they burn out. Set up your furniture so you have a clear path. Avoid moving your furniture around. If any of your floors are uneven, fix them. If there are any pets around you, be aware of where they are. Review your medicines with your doctor. Some medicines can make you feel dizzy. This can increase your chance of falling. Ask your doctor what other things that you can do to help prevent falls. This information is not intended to replace advice given to you by your health care provider. Make sure you discuss any questions you have with your health care provider. Document Released: 11/28/2008 Document Revised: 07/10/2015 Document Reviewed: 03/08/2014 Elsevier Interactive Patient Education  2017 [employer].

## 2022-05-10 ENCOUNTER — Other Ambulatory Visit: Payer: Self-pay | Admitting: Internal Medicine

## 2022-05-11 ENCOUNTER — Encounter: Payer: Self-pay | Admitting: Internal Medicine

## 2022-05-11 NOTE — Telephone Encounter (Signed)
I would recommend allergy medication for allergy issues such as flonase, zyrtec, claritin to help treat symptoms. I would not recommend to refill this strong cough medicine for allergies.

## 2022-06-24 ENCOUNTER — Ambulatory Visit (INDEPENDENT_AMBULATORY_CARE_PROVIDER_SITE_OTHER): Payer: Medicare Other | Admitting: Internal Medicine

## 2022-06-24 ENCOUNTER — Encounter: Payer: Self-pay | Admitting: Internal Medicine

## 2022-06-24 ENCOUNTER — Other Ambulatory Visit: Payer: Self-pay | Admitting: Internal Medicine

## 2022-06-24 VITALS — BP 150/80 | HR 53 | Temp 98.8°F | Ht 65.0 in | Wt 259.0 lb

## 2022-06-24 DIAGNOSIS — R7301 Impaired fasting glucose: Secondary | ICD-10-CM

## 2022-06-24 DIAGNOSIS — E782 Mixed hyperlipidemia: Secondary | ICD-10-CM | POA: Diagnosis not present

## 2022-06-24 DIAGNOSIS — G4700 Insomnia, unspecified: Secondary | ICD-10-CM

## 2022-06-24 DIAGNOSIS — E039 Hypothyroidism, unspecified: Secondary | ICD-10-CM | POA: Diagnosis not present

## 2022-06-24 DIAGNOSIS — R7303 Prediabetes: Secondary | ICD-10-CM

## 2022-06-24 DIAGNOSIS — I10 Essential (primary) hypertension: Secondary | ICD-10-CM

## 2022-06-24 DIAGNOSIS — K219 Gastro-esophageal reflux disease without esophagitis: Secondary | ICD-10-CM

## 2022-06-24 LAB — COMPREHENSIVE METABOLIC PANEL
ALT: 16 U/L (ref 0–35)
AST: 19 U/L (ref 0–37)
Albumin: 4.3 g/dL (ref 3.5–5.2)
Alkaline Phosphatase: 49 U/L (ref 39–117)
BUN: 34 mg/dL — ABNORMAL HIGH (ref 6–23)
CO2: 29 mEq/L (ref 19–32)
Calcium: 9.7 mg/dL (ref 8.4–10.5)
Chloride: 105 mEq/L (ref 96–112)
Creatinine, Ser: 1.06 mg/dL (ref 0.40–1.20)
GFR: 52.33 mL/min — ABNORMAL LOW (ref >60.00)
Glucose, Bld: 103 mg/dL — ABNORMAL HIGH (ref 70–99)
Potassium: 4.8 mEq/L (ref 3.5–5.1)
Sodium: 141 mEq/L (ref 135–145)
Total Bilirubin: 0.6 mg/dL (ref 0.2–1.2)
Total Protein: 6.8 g/dL (ref 6.0–8.3)

## 2022-06-24 LAB — CBC
HCT: 37.7 % (ref 36.0–46.0)
Hemoglobin: 13 g/dL (ref 12.0–15.0)
MCHC: 34.5 g/dL (ref 30.0–36.0)
MCV: 89.6 fl (ref 78.0–100.0)
Platelets: 201 10*3/uL (ref 150.0–400.0)
RBC: 4.21 Mil/uL (ref 3.87–5.11)
RDW: 13.7 % (ref 11.5–15.5)
WBC: 7.4 10*3/uL (ref 4.0–10.5)

## 2022-06-24 LAB — LIPID PANEL
Cholesterol: 177 mg/dL (ref 0–200)
HDL: 66.2 mg/dL (ref >39.00)
LDL Cholesterol: 92 mg/dL (ref 0–99)
NonHDL: 110.62
Total CHOL/HDL Ratio: 3
Triglycerides: 94 mg/dL (ref 0.0–149.0)
VLDL: 18.8 mg/dL (ref 0.0–40.0)

## 2022-06-24 LAB — TSH: TSH: 1.27 u[IU]/mL (ref 0.35–5.50)

## 2022-06-24 LAB — HEMOGLOBIN A1C: Hgb A1c MFr Bld: 5.5 % (ref 4.6–6.5)

## 2022-06-24 MED ORDER — SEMAGLUTIDE-WEIGHT MANAGEMENT 1 MG/0.5ML ~~LOC~~ SOAJ: 1.0000 mg | SUBCUTANEOUS | 0 refills | Status: AC

## 2022-06-24 MED ORDER — SEMAGLUTIDE-WEIGHT MANAGEMENT 0.25 MG/0.5ML ~~LOC~~ SOAJ: 0.2500 mg | SUBCUTANEOUS | 0 refills | Status: AC

## 2022-06-24 MED ORDER — SEMAGLUTIDE-WEIGHT MANAGEMENT 1.7 MG/0.75ML ~~LOC~~ SOAJ: 1.7000 mg | SUBCUTANEOUS | 0 refills | Status: AC

## 2022-06-24 MED ORDER — SEMAGLUTIDE-WEIGHT MANAGEMENT 0.5 MG/0.5ML ~~LOC~~ SOAJ: 0.5000 mg | SUBCUTANEOUS | 0 refills | Status: AC

## 2022-06-24 MED ORDER — SEMAGLUTIDE-WEIGHT MANAGEMENT 2.4 MG/0.75ML ~~LOC~~ SOAJ: 2.4000 mg | SUBCUTANEOUS | 0 refills | Status: AC

## 2022-06-24 NOTE — Progress Notes (Signed)
   Subjective:   Patient ID: Laura Hawkins, female    DOB: 04-Aug-1950, 72 y.o.   MRN: [id number]  HPI The patient is a 72 YO female coming in for yearly follow up and concerned about weight.   Review of Systems  Constitutional: Negative.   HENT: Negative.    Eyes: Negative.   Respiratory:  Negative for cough, chest tightness and shortness of breath.   Cardiovascular:  Negative for chest pain, palpitations and leg swelling.  Gastrointestinal:  Negative for abdominal distention, abdominal pain, constipation, diarrhea, nausea and vomiting.  Musculoskeletal:  Positive for arthralgias.  Skin: Negative.   Neurological: Negative.   Psychiatric/Behavioral: Negative.      Objective:  Physical Exam Constitutional:      Appearance: She is well-developed. She is obese.  HENT:     Head: Normocephalic and atraumatic.  Cardiovascular:     Rate and Rhythm: Normal rate and regular rhythm.  Pulmonary:     Effort: Pulmonary effort is normal. No respiratory distress.     Breath sounds: Normal breath sounds. No wheezing or rales.  Abdominal:     General: Bowel sounds are normal. There is no distension.     Palpations: Abdomen is soft.     Tenderness: There is no abdominal tenderness. There is no rebound.  Musculoskeletal:        General: Tenderness present.     Cervical back: Normal range of motion.  Skin:    General: Skin is warm and dry.  Neurological:     Mental Status: She is alert and oriented to person, place, and time.     Coordination: Coordination normal.     Vitals:   06/24/22 1002 06/24/22 1005  BP: (!) 150/80 (!) 150/80  Pulse: (!) 53   Temp: 98.8 F (37.1 C)   TempSrc: Oral   SpO2: 95%   Weight: 259 lb (117.5 kg)   Height: 5\' 5"  (1.651 m)     Assessment & Plan:

## 2022-06-24 NOTE — Patient Instructions (Addendum)
We will check the labs today. We have sent in wegovy to start for the weight.

## 2022-06-25 NOTE — Assessment & Plan Note (Signed)
Checking HgA1c and adjust as needed.  

## 2022-06-25 NOTE — Assessment & Plan Note (Signed)
BP mildly elevated without meds today. Typically is at goal on her lisinopril/hctz 20/25 and diltiazem 120 mg daily

## 2022-06-25 NOTE — Assessment & Plan Note (Signed)
Taking trazodone 100 mg daily and working well. Continue.

## 2022-06-25 NOTE — Assessment & Plan Note (Signed)
Rx wegovy standard titration to help with weight. She is willing to make changes.

## 2022-06-25 NOTE — Assessment & Plan Note (Signed)
Is taking protonix 40 mg daily for this and stable overall. Continue for now as she is on NSAID for arthritis currently.

## 2022-06-25 NOTE — Assessment & Plan Note (Signed)
Checking TSH and adjust as needed synthroid 112 mcg daily.

## 2022-06-25 NOTE — Assessment & Plan Note (Signed)
Checking lipid panel and adjust pravastatin 80 mg daily for goal LDL <100.

## 2022-07-02 ENCOUNTER — Ambulatory Visit: Payer: Medicare Other | Admitting: Podiatry

## 2022-07-08 ENCOUNTER — Ambulatory Visit (INDEPENDENT_AMBULATORY_CARE_PROVIDER_SITE_OTHER): Payer: Medicare Other | Admitting: Podiatry

## 2022-07-08 ENCOUNTER — Encounter: Payer: Self-pay | Admitting: Podiatry

## 2022-07-08 DIAGNOSIS — M79675 Pain in left toe(s): Secondary | ICD-10-CM

## 2022-07-08 DIAGNOSIS — B351 Tinea unguium: Secondary | ICD-10-CM

## 2022-07-08 DIAGNOSIS — M79674 Pain in right toe(s): Secondary | ICD-10-CM | POA: Diagnosis not present

## 2022-07-08 DIAGNOSIS — L608 Other nail disorders: Secondary | ICD-10-CM

## 2022-07-08 NOTE — Progress Notes (Signed)
This patient returns to the office for evaluation and treatment of long thick painful nails .  This patient is unable to trim his own nails since the patient cannot reach his feet.  Patient says the nails are painful walking and wearing his shoes.  He returns for preventive foot care services.  General Appearance  Alert, conversant and in no acute stress.  Vascular  Dorsalis pedis and posterior tibial  pulses are palpable  bilaterally.  Capillary return is within normal limits  bilaterally. Temperature is within normal limits  bilaterally.  Neurologic  Senn-Weinstein monofilament wire test within normal limits  bilaterally. Muscle power within normal limits bilaterally.  Nails Thick disfigured discolored nails with subungual debris  from hallux to fifth toes bilaterally. No evidence of bacterial infection or drainage bilaterally.  Orthopedic  No limitations of motion  feet .  No crepitus or effusions noted.  No bony pathology or digital deformities noted.  Skin  normotropic skin with no porokeratosis noted bilaterally.  No signs of infections or ulcers noted.     Onychomycosis  Pain in toes right foot  Pain in toes left foot  Debridement  of nails  1-5  B/L with a nail nipper.  Nails were then filed using a dremel tool with no incidents.    RTC  10 weeks    Cace Osorto DPM  

## 2022-07-14 ENCOUNTER — Other Ambulatory Visit: Payer: Self-pay | Admitting: Internal Medicine

## 2022-07-19 ENCOUNTER — Other Ambulatory Visit: Payer: Self-pay | Admitting: Internal Medicine

## 2022-08-09 ENCOUNTER — Encounter: Payer: Self-pay | Admitting: Internal Medicine

## 2022-08-10 NOTE — Telephone Encounter (Signed)
A representative from [employer] called and said they have faxed over a request for patient's C-Pap supplies on 08/06/2022. They are also re-faxing it today 08/10/2022.

## 2022-08-11 NOTE — Telephone Encounter (Signed)
Received and placed inside Dr Frutoso Chase box

## 2022-08-11 NOTE — Telephone Encounter (Signed)
Dr Okey Dupre has singed the forms and I have fax them back over to [employer]. I will also reach out patient and inform him of this as well.

## 2022-09-17 ENCOUNTER — Ambulatory Visit: Payer: Medicare Other | Admitting: Podiatry

## 2022-09-17 ENCOUNTER — Encounter: Payer: Self-pay | Admitting: Podiatry

## 2022-09-17 DIAGNOSIS — M79672 Pain in left foot: Secondary | ICD-10-CM

## 2022-09-17 DIAGNOSIS — B351 Tinea unguium: Secondary | ICD-10-CM | POA: Diagnosis not present

## 2022-09-17 DIAGNOSIS — M79674 Pain in right toe(s): Secondary | ICD-10-CM

## 2022-09-17 DIAGNOSIS — M79675 Pain in left toe(s): Secondary | ICD-10-CM

## 2022-09-17 DIAGNOSIS — L608 Other nail disorders: Secondary | ICD-10-CM

## 2022-09-17 DIAGNOSIS — M79671 Pain in right foot: Secondary | ICD-10-CM

## 2022-09-17 NOTE — Progress Notes (Signed)
This patient returns to the office for evaluation and treatment of long thick painful nails .  This patient is unable to trim his own nails since the patient cannot reach his feet.  Patient says the nails are painful walking and wearing his shoes.  He returns for preventive foot care services.  General Appearance  Alert, conversant and in no acute stress.  Vascular  Dorsalis pedis and posterior tibial  pulses are palpable  bilaterally.  Capillary return is within normal limits  bilaterally. Temperature is within normal limits  bilaterally.  Neurologic  Senn-Weinstein monofilament wire test within normal limits  bilaterally. Muscle power within normal limits bilaterally.  Nails Thick disfigured discolored nails with subungual debris  from hallux to fifth toes bilaterally. No evidence of bacterial infection or drainage bilaterally.  Orthopedic  No limitations of motion  feet .  No crepitus or effusions noted.  No bony pathology or digital deformities noted.  Skin  normotropic skin with no porokeratosis noted bilaterally.  No signs of infections or ulcers noted.     Onychomycosis  Pain in toes right foot  Pain in toes left foot  Debridement  of nails  1-5  B/L with a nail nipper.  Nails were then filed using a dremel tool with no incidents.    RTC  10 weeks    Lovell Roe DPM  

## 2022-10-14 ENCOUNTER — Other Ambulatory Visit: Payer: Self-pay | Admitting: Internal Medicine

## 2022-10-19 ENCOUNTER — Other Ambulatory Visit: Payer: Self-pay | Admitting: Internal Medicine

## 2022-11-19 ENCOUNTER — Other Ambulatory Visit: Payer: Self-pay | Admitting: Internal Medicine

## 2022-11-19 DIAGNOSIS — Z1231 Encounter for screening mammogram for malignant neoplasm of breast: Secondary | ICD-10-CM

## 2022-11-25 ENCOUNTER — Ambulatory Visit: Payer: Medicare Other | Admitting: Podiatry

## 2022-11-25 ENCOUNTER — Encounter: Payer: Self-pay | Admitting: Podiatry

## 2022-11-25 DIAGNOSIS — M79675 Pain in left toe(s): Secondary | ICD-10-CM | POA: Diagnosis not present

## 2022-11-25 DIAGNOSIS — M79672 Pain in left foot: Secondary | ICD-10-CM

## 2022-11-25 DIAGNOSIS — B351 Tinea unguium: Secondary | ICD-10-CM

## 2022-11-25 DIAGNOSIS — M79674 Pain in right toe(s): Secondary | ICD-10-CM | POA: Diagnosis not present

## 2022-11-25 DIAGNOSIS — L608 Other nail disorders: Secondary | ICD-10-CM

## 2022-11-25 DIAGNOSIS — M79671 Pain in right foot: Secondary | ICD-10-CM

## 2022-11-25 NOTE — Progress Notes (Signed)
This patient returns to the office for evaluation and treatment of long thick painful nails .  This patient is unable to trim his own nails since the patient cannot reach his feet.  Patient says the nails are painful walking and wearing his shoes.  He returns for preventive foot care services.  Patient asks for only dremel use on her big toenails.  General Appearance  Alert, conversant and in no acute stress.  Vascular  Dorsalis pedis and posterior tibial  pulses are palpable  bilaterally.  Capillary return is within normal limits  bilaterally. Temperature is within normal limits  bilaterally.  Neurologic  Senn-Weinstein monofilament wire test within normal limits  bilaterally. Muscle power within normal limits bilaterally.  Nails Thick disfigured discolored nails with subungual debris  from hallux to fifth toes bilaterally. No evidence of bacterial infection or drainage bilaterally.  Orthopedic  No limitations of motion  feet .  No crepitus or effusions noted.  No bony pathology or digital deformities noted.  Skin  normotropic skin with no porokeratosis noted bilaterally.  No signs of infections or ulcers noted.     Onychomycosis  Pain in toes right foot  Pain in toes left foot  Debridement  of nails  1-5  B/L with a nail nipper.  Nails were then filed using a dremel tool with no incidents.    RTC  10 weeks    Helane Gunther DPM

## 2022-12-22 ENCOUNTER — Ambulatory Visit: Payer: Medicare Other | Admitting: Internal Medicine

## 2022-12-29 ENCOUNTER — Ambulatory Visit: Payer: Medicare Other

## 2022-12-31 ENCOUNTER — Other Ambulatory Visit: Payer: Self-pay | Admitting: Internal Medicine

## 2022-12-31 ENCOUNTER — Ambulatory Visit (INDEPENDENT_AMBULATORY_CARE_PROVIDER_SITE_OTHER): Payer: Medicare Other | Admitting: Internal Medicine

## 2022-12-31 ENCOUNTER — Encounter: Payer: Self-pay | Admitting: Internal Medicine

## 2022-12-31 DIAGNOSIS — I10 Essential (primary) hypertension: Secondary | ICD-10-CM

## 2022-12-31 MED ORDER — SEMAGLUTIDE-WEIGHT MANAGEMENT 2.4 MG/0.75ML ~~LOC~~ SOAJ: 2.4000 mg | SUBCUTANEOUS | 0 refills | Status: AC

## 2022-12-31 MED ORDER — SEMAGLUTIDE-WEIGHT MANAGEMENT 1.7 MG/0.75ML ~~LOC~~ SOAJ: 1.7000 mg | SUBCUTANEOUS | 0 refills | Status: AC

## 2022-12-31 MED ORDER — SEMAGLUTIDE-WEIGHT MANAGEMENT 0.5 MG/0.5ML ~~LOC~~ SOAJ: 0.5000 mg | SUBCUTANEOUS | 0 refills | Status: AC

## 2022-12-31 MED ORDER — SEMAGLUTIDE-WEIGHT MANAGEMENT 0.25 MG/0.5ML ~~LOC~~ SOAJ: 0.2500 mg | SUBCUTANEOUS | 0 refills | Status: AC

## 2022-12-31 MED ORDER — SEMAGLUTIDE-WEIGHT MANAGEMENT 1 MG/0.5ML ~~LOC~~ SOAJ: 1.0000 mg | SUBCUTANEOUS | 0 refills | Status: AC

## 2022-12-31 NOTE — Patient Instructions (Signed)
We have sent in the wegovy. If you can't get it let us know.

## 2022-12-31 NOTE — Assessment & Plan Note (Signed)
BP mildly elevated and will monitor. Prior at goal on same regimen lisinopril/hydrochlorothiazide 20/25 and diltiazem 120 mg daily. Prescribing wegovy for weight loss which should help.

## 2022-12-31 NOTE — Assessment & Plan Note (Signed)
Is limited due to back pain with exercise. Rx wegovy standard titration and will monitor for weight loss on this. She is aware of side effects. She does have hypertension and hyperlipidemia and OSA and pre-diabetes and would benefit from this.

## 2022-12-31 NOTE — Progress Notes (Signed)
   Subjective:   Patient ID: Laura Hawkins, female    DOB: [birth year], 72 y.o.   MRN: [id number]  HPI The patient is a 72 YO female coming in for follow up. Was unable to get wegovy due to it possibly needing PA or pharmacy not getting it unsure.   Review of Systems  Constitutional: Negative.   HENT: Negative.    Eyes: Negative.   Respiratory:  Negative for cough, chest tightness and shortness of breath.   Cardiovascular:  Negative for chest pain, palpitations and leg swelling.  Gastrointestinal:  Negative for abdominal distention, abdominal pain, constipation, diarrhea, nausea and vomiting.  Musculoskeletal: Negative.   Skin: Negative.   Neurological: Negative.   Psychiatric/Behavioral: Negative.      Objective:  Physical Exam Constitutional:      Appearance: She is well-developed. She is obese.  HENT:     Head: Normocephalic and atraumatic.  Cardiovascular:     Rate and Rhythm: Normal rate and regular rhythm.  Pulmonary:     Effort: Pulmonary effort is normal. No respiratory distress.     Breath sounds: Normal breath sounds. No wheezing or rales.  Abdominal:     General: Bowel sounds are normal. There is no distension.     Palpations: Abdomen is soft.     Tenderness: There is no abdominal tenderness. There is no rebound.  Musculoskeletal:     Cervical back: Normal range of motion.  Skin:    General: Skin is warm and dry.  Neurological:     Mental Status: She is alert and oriented to person, place, and time.     Coordination: Coordination normal.     Vitals:   12/31/22 1041 12/31/22 1048  BP: (!) 144/68 (!) 144/68  Pulse: 87   Temp: 98.7 F (37.1 C)   TempSrc: Oral   SpO2: 98%   Weight: 256 lb (116.1 kg)   Height: 5\' 5"  (1.651 m)     Assessment & Plan:

## 2023-01-09 ENCOUNTER — Other Ambulatory Visit: Payer: Self-pay | Admitting: Internal Medicine

## 2023-01-27 ENCOUNTER — Ambulatory Visit
Admission: RE | Admit: 2023-01-27 | Discharge: 2023-01-27 | Disposition: A | Discharge status: Home / Self Care | Payer: Medicare Other | Source: Ambulatory Visit | Attending: Internal Medicine | Admitting: Internal Medicine

## 2023-01-27 DIAGNOSIS — Z1231 Encounter for screening mammogram for malignant neoplasm of breast: Secondary | ICD-10-CM

## 2023-01-31 ENCOUNTER — Encounter: Payer: Self-pay | Admitting: Internal Medicine

## 2023-01-31 LAB — HM MAMMOGRAPHY

## 2023-02-03 ENCOUNTER — Ambulatory Visit (INDEPENDENT_AMBULATORY_CARE_PROVIDER_SITE_OTHER): Payer: Medicare Other | Admitting: Podiatry

## 2023-02-03 DIAGNOSIS — L03119 Cellulitis of unspecified part of limb: Secondary | ICD-10-CM | POA: Diagnosis not present

## 2023-02-03 DIAGNOSIS — B351 Tinea unguium: Secondary | ICD-10-CM

## 2023-02-03 DIAGNOSIS — M79675 Pain in left toe(s): Secondary | ICD-10-CM | POA: Diagnosis not present

## 2023-02-03 DIAGNOSIS — M79674 Pain in right toe(s): Secondary | ICD-10-CM

## 2023-02-03 DIAGNOSIS — L02619 Cutaneous abscess of unspecified foot: Secondary | ICD-10-CM | POA: Insufficient documentation

## 2023-02-03 DIAGNOSIS — L608 Other nail disorders: Secondary | ICD-10-CM | POA: Diagnosis not present

## 2023-02-03 MED ORDER — DOXYCYCLINE HYCLATE 100 MG PO TABS: 100.0000 mg | ORAL_TABLET | Freq: Two times a day (BID) | ORAL | 0 refills | Status: DC

## 2023-02-03 NOTE — Progress Notes (Signed)
This patient returns to the office for evaluation and treatment of long thick painful nails .  This patient is unable to trim her own nails since the patient cannot reach her feet.  Patient says the nails are painful walking and wearing his shoes.  Sh e returns for preventive foot care services. She also says she injured her big toe right foot two weeks ago.  She says it initially improved but since has become red and inflamed and makes wearing shoes painful. Patient asks for only dremel use on her big toenails.  General Appearance  Alert, conversant and in no acute stress.  Vascular  Dorsalis pedis and posterior tibial  pulses are palpable  bilaterally.  Capillary return is within normal limits  bilaterally. Temperature is within normal limits  bilaterally.  Neurologic  Senn-Weinstein monofilament wire test within normal limits  bilaterally. Muscle power within normal limits bilaterally.  Nails Thick disfigured discolored nails with subungual debris  from hallux to fifth toes bilaterally. No evidence of bacterial infection or drainage bilaterally.  Orthopedic  No limitations of motion  feet .  No crepitus or effusions noted.  No bony pathology or digital deformities noted.  Skin  normotropic skin with no porokeratosis noted bilaterally.  No signs of infections or ulcers noted.  Red inflamed medial plantar aspect right hallux.  Painful upon palpation.   Onychomycosis  Pain in toes right foot  Pain in toes left foot  Cellulitis right hallux.  Debridement  of nails  1-5  B/L with a nail nipper.  Nails were then filed using a dremel tool with no incidents.   Prescribed doxycycline for patient.  This could be a burn which got infected.  Told her not to wear shoes on spot of redness.   RTC  10 weeks    Helane Gunther DPM

## 2023-04-07 ENCOUNTER — Ambulatory Visit: Payer: Medicare Other | Admitting: Podiatry

## 2023-04-07 ENCOUNTER — Other Ambulatory Visit: Payer: Self-pay | Admitting: Internal Medicine

## 2023-04-14 ENCOUNTER — Ambulatory Visit (INDEPENDENT_AMBULATORY_CARE_PROVIDER_SITE_OTHER): Payer: Medicare Other | Admitting: Podiatry

## 2023-04-14 ENCOUNTER — Encounter: Payer: Self-pay | Admitting: Podiatry

## 2023-04-14 DIAGNOSIS — L608 Other nail disorders: Secondary | ICD-10-CM | POA: Diagnosis not present

## 2023-04-14 DIAGNOSIS — M79674 Pain in right toe(s): Secondary | ICD-10-CM

## 2023-04-14 DIAGNOSIS — M79675 Pain in left toe(s): Secondary | ICD-10-CM

## 2023-04-14 DIAGNOSIS — B351 Tinea unguium: Secondary | ICD-10-CM | POA: Diagnosis not present

## 2023-04-14 NOTE — Progress Notes (Signed)
 This patient returns to the office for evaluation and treatment of long thick painful nails .  This patient is unable to trim her own nails since the patient cannot reach her feet.  Patient says the nails are painful walking and wearing his shoes.  Sh e returns for preventive foot care services.  Patient asks for only dremel use on her big toenails.  General Appearance  Alert, conversant and in no acute stress.  Vascular  Dorsalis pedis and posterior tibial  pulses are palpable  bilaterally.  Capillary return is within normal limits  bilaterally. Temperature is within normal limits  bilaterally.  Neurologic  Senn-Weinstein monofilament wire test within normal limits  bilaterally. Muscle power within normal limits bilaterally.  Nails Thick disfigured discolored nails with subungual debris  from hallux to fifth toes bilaterally. No evidence of bacterial infection or drainage bilaterally.  Orthopedic  No limitations of motion  feet .  No crepitus or effusions noted.  No bony pathology or digital deformities noted.  Skin  normotropic skin with no porokeratosis noted bilaterally.  No signs of infections or ulcers noted.    Onychomycosis  Pain in toes right foot  Pain in toes left foot    Debridement  of nails  1-5  B/L with a nail nipper.  Nails were then filed using a dremel tool with no incidents.   RTC  10 weeks    Helane Gunther DPM

## 2023-05-09 ENCOUNTER — Ambulatory Visit (INDEPENDENT_AMBULATORY_CARE_PROVIDER_SITE_OTHER): Payer: Medicare Other

## 2023-05-09 VITALS — Ht 65.0 in | Wt 256.0 lb

## 2023-05-09 DIAGNOSIS — M8580 Other specified disorders of bone density and structure, unspecified site: Secondary | ICD-10-CM

## 2023-05-09 DIAGNOSIS — Z0000 Encounter for general adult medical examination without abnormal findings: Secondary | ICD-10-CM

## 2023-05-09 DIAGNOSIS — Z780 Asymptomatic menopausal state: Secondary | ICD-10-CM

## 2023-05-09 NOTE — Progress Notes (Signed)
 Subjective:   Laura Hawkins is a 73 y.o. who presents for a Medicare Wellness preventive visit.  Visit Complete: Virtual I connected with  Laura Hawkins on 05/09/23 by a audio enabled telemedicine application and verified that I am speaking with the correct person using two identifiers.  Patient Location: Home  Provider Location: Home Office  I discussed the limitations of evaluation and management by telemedicine. The patient expressed understanding and agreed to proceed.  Vital Signs: Because this visit was a virtual/telehealth visit, some criteria may be missing or patient reported. Any vitals not documented were not able to be obtained and vitals that have been documented are patient reported.  VideoDeclined- This patient declined Librarian, academic. Therefore the visit was completed with audio only.  Persons Participating in Visit: Patient.  AWV Questionnaire: Yes: Patient Medicare AWV questionnaire was completed by the patient on 05/05/2023; I have confirmed that all information answered by patient is correct and no changes since this date.  Cardiac Risk Factors include: advanced age (>46men, >32 women);hypertension;Other (see comment);dyslipidemia, Risk factor comments: Sleep Apnea, Osteopenia     Objective:    Today's Vitals   05/09/23 1051  Weight: 256 lb (116.1 kg)  Height: 5\' 5"  (1.651 m)   Body mass index is 42.6 kg/m.     05/09/2023   11:01 AM 05/07/2022   11:29 AM 05/04/2021   10:33 AM 04/15/2020    9:42 AM  Advanced Directives  Does Patient Have a Medical Advance Directive? No No No No  Would patient like information on creating a medical advance directive?  No - Patient declined No - Patient declined Yes (MAU/Ambulatory/Procedural Areas - Information given)    Current Medications (verified) Outpatient Encounter Medications as of 05/09/2023  Medication Sig   B Complex Vitamins (VITAMIN B COMPLEX) TABS Take by mouth daily.    Calcium-Magnesium-Vitamin D (CALCIUM MAGNESIUM PO) Take by mouth daily.   Cholecalciferol 25 MCG (1000 UT) capsule Take 2,000 Units by mouth daily.   clobetasol (TEMOVATE) 0.05 % external solution Apply topically.   diltiazem (CARDIZEM CD) 120 MG 24 hr capsule Take 120 mg by mouth daily.   ibuprofen (ADVIL) 200 MG tablet Take 200 mg by mouth every 6 (six) hours as needed.   levothyroxine (SYNTHROID) 112 MCG tablet TAKE 1 TABLET BY MOUTH EVERY DAY BEFORE BREAKFAST   lisinopril-hydrochlorothiazide (ZESTORETIC) 20-25 MG tablet TAKE 1 TABLET BY MOUTH EVERY DAY   meloxicam (MOBIC) 15 MG tablet TAKE 1 TABLET (15 MG TOTAL) BY MOUTH DAILY.   Omega-3 Fatty Acids (FISH OIL) 1000 MG CAPS Take by mouth.   pantoprazole (PROTONIX) 40 MG tablet TAKE 1 TABLET BY MOUTH EVERY DAY   pravastatin (PRAVACHOL) 80 MG tablet TAKE 1 TABLET BY MOUTH EVERY DAY   Semaglutide-Weight Management 2.4 MG/0.75ML SOAJ Inject 2.4 mg into the skin once a week for 28 days.   TIADYLT ER 120 MG 24 hr capsule TAKE 1 CAPSULE BY MOUTH EVERY DAY   traZODone (DESYREL) 100 MG tablet TAKE 1 TABLET BY MOUTH EVERYDAY AT BEDTIME   doxycycline (VIBRA-TABS) 100 MG tablet Take 1 tablet (100 mg total) by mouth 2 (two) times daily.   No facility-administered encounter medications on file as of 05/09/2023.    Allergies (verified) Penicillins, Poison ivy extract, Poison oak extract, and Bee venom   History: Past Medical History:  Diagnosis Date   GERD (gastroesophageal reflux disease)    on meds   Hyperlipidemia    on meds   Hypertension  on meds   Sleep apnea    uses CPAP   Thyroid disease    on meds   Past Surgical History:  Procedure Laterality Date   COLONOSCOPY  2013   [place]- normal -1 polyp- 10 yr follow   dislocated right elbow Right 2013   HAMMER TOE SURGERY Right    TREATMENT FISTULA ANAL Left 2020   WISDOM TOOTH EXTRACTION     WRIST FRACTURE SURGERY Left 2001   Family History  Problem Relation Age of Onset    Dementia Mother    Pancreatitis Father    Pancreatic cancer Brother    Colon cancer Neg Hx    Colon polyps Neg Hx    Esophageal cancer Neg Hx    Stomach cancer Neg Hx    Rectal cancer Neg Hx    Social History   Socioeconomic History   Marital status: Married    Spouse name: Ramon Dredge   Number of children: Not on file   Years of education: Not on file   Highest education level: Master's degree (e.g., MA, MS, MEng, MEd, MSW, MBA)  Occupational History   Occupation: RETIRED  Tobacco Use   Smoking status: Former   Smokeless tobacco: Never  Advertising account planner   Vaping status: Never Used  Substance and Sexual Activity   Alcohol use: Not Currently    Alcohol/week: 0.0 - 7.0 standard drinks of alcohol    Comment: Social   Drug use: Never   Sexual activity: Yes    Partners: Male    Birth control/protection: Post-menopausal  Other Topics Concern   Not on file  Social History Narrative   Lives at home with husband.  @2dogs  and 1 cat.   Social Drivers of Corporate investment banker Strain: Low Risk  (05/05/2023)   Overall Financial Resource Strain (CARDIA)    Difficulty of Paying Living Expenses: Not hard at all  Food Insecurity: No Food Insecurity (05/05/2023)   Hunger Vital Sign    Worried About Running Out of Food in the Last Year: Never true    Ran Out of Food in the Last Year: Never true  Transportation Needs: No Transportation Needs (05/05/2023)   PRAPARE - Administrator, Civil Service (Medical): No    Lack of Transportation (Non-Medical): No  Physical Activity: Insufficiently Active (05/05/2023)   Exercise Vital Sign    Days of Exercise per Week: 1 day    Minutes of Exercise per Session: 60 min  Stress: Stress Concern Present (05/05/2023)   [employer] of Occupational Health - Occupational Stress Questionnaire    Feeling of Stress : Rather much  Social Connections: Moderately Integrated (05/05/2023)   Social Connection and Isolation Panel [NHANES]    Frequency  of Communication with Friends and Family: Twice a week    Frequency of Social Gatherings with Friends and Family: Three times a week    Attends Religious Services: Never    Active Member of Clubs or Organizations: Yes    Attends Engineer, structural: More than 4 times per year    Marital Status: Married    Tobacco Counseling Counseling given: Not Answered    Clinical Intake:     Pain : No/denies pain     BMI - recorded: 42.6 Nutritional Status: BMI > 30  Obese Nutritional Risks: None Diabetes: No  Lab Results  Component Value Date   HGBA1C 5.5 06/24/2022   HGBA1C 5.2 06/25/2021   HGBA1C 5.7 06/24/2020     How often  do you need to have someone help you when you read instructions, pamphlets, or other written materials from your doctor or pharmacy?: 1 - Never  Interpreter Needed?: No  Information entered by :: Velina Drollinger, RMA   Activities of Daily Living    05/09/2023   10:57 AM  In your present state of health, do you have any difficulty performing the following activities:  Hearing? 1  Comment wear hearing aids  Vision? 0  Difficulty concentrating or making decisions? 0  Walking or climbing stairs? 0  Dressing or bathing? 0  Doing errands, shopping? 0  Preparing Food and eating ? N  Using the Toilet? N  In the past six months, have you accidently leaked urine? Y  Do you have problems with loss of bowel control? Y  Comment Anal fistula  Managing your Medications? N  Managing your Finances? N  Housekeeping or managing your Housekeeping? N    Patient Care Team: Myrlene Broker, MD as PCP - General (Internal Medicine) Carman Ching, OD (Optometry) Helane Gunther, DPM as Consulting Physician (Podiatry) Aris Lot, MD as Consulting Physician (Dermatology)  Indicate any recent Medical Services you may have received from other than [facility] providers in the past year (date may be approximate).     Assessment:   This is a routine  wellness examination for Laura Hawkins.  Hearing/Vision screen Hearing Screening - Comments:: Wears hearing aides  Vision Screening - Comments:: Wears eyeglasses   Goals Addressed             This Visit's Progress    Increase physical activity   On track      Depression Screen     05/09/2023   11:04 AM 06/24/2022   10:07 AM 05/07/2022   11:28 AM 12/24/2021   10:49 AM 06/25/2021   10:47 AM 05/04/2021   10:34 AM 05/04/2021   10:32 AM  PHQ 2/9 Scores  PHQ - 2 Score 0 0 0 0 2 0 0  PHQ- 9 Score 0   0 4      Fall Risk     05/09/2023   11:01 AM 12/31/2022   10:46 AM 12/31/2022   10:41 AM 06/24/2022   10:07 AM 05/07/2022   11:26 AM  Fall Risk   Falls in the past year? 1 1 0 0 1  Number falls in past yr: 0 0 0 0 0  Injury with Fall? 1  0 0 0  Risk for fall due to :     History of fall(s)  Follow up Falls evaluation completed;Falls prevention discussed Falls evaluation completed Falls evaluation completed Falls evaluation completed Falls evaluation completed;Falls prevention discussed;Education provided    MEDICARE RISK AT HOME:  Medicare Risk at Home Any stairs in or around the home?: Yes If so, are there any without handrails?: Yes Home free of loose throw rugs in walkways, pet beds, electrical cords, etc?: Yes Adequate lighting in your home to reduce risk of falls?: Yes Life alert?: No Use of a cane, walker or w/c?: Yes (cane) Grab bars in the bathroom?: Yes Shower chair or bench in shower?: Yes Elevated toilet seat or a handicapped toilet?: Yes  TIMED UP AND GO:  Was the test performed?  No  Cognitive Function: Normal: Normal cognitive status assessed by direct observation by this Clinical Health Advisor. No abnormalities found. Patient is able to answer questions in an accurate and timely manner.        05/07/2022   11:30 AM  6CIT Screen  What Year? 0 points  What month? 0 points  What time? 0 points  Count back from 20 0 points  Months in reverse 0 points  Repeat  phrase 0 points  Total Score 0 points    Immunizations Immunization History  Administered Date(s) Administered   Fluad Quad(high Dose 65+) 10/23/2020   Influenza Split 03/20/2013   Influenza, High Dose Seasonal PF 01/01/2015, 12/05/2015, 02/25/2017, 11/11/2017, 10/16/2018, 12/24/2022   Influenza,inj,Quad PF,6+ Mos 12/24/2021   Influenza-Unspecified 11/14/2019   PFIZER Comirnaty(Gray Top)Covid-19 Tri-Sucrose Vaccine 12/10/2021   PFIZER(Purple Top)SARS-COV-2 Vaccination 03/15/2019, 04/05/2019, 11/14/2019, 11/27/2020   Pneumococcal Conjugate-13 09/29/2015   Pneumococcal Polysaccharide-23 09/20/2014   Respiratory Syncytial Virus Vaccine,Recomb Aduvanted(Arexvy) 12/29/2022   Tdap 11/28/2008   Zoster Recombinant(Shingrix) 05/12/2017, 07/16/2017    Screening Tests Health Maintenance  Topic Date Due   Hepatitis C Screening  Never done   DTaP/Tdap/Td (2 - Td or Tdap) 11/29/2018   COVID-19 Vaccine (6 - 2024-25 season) 10/17/2022   MAMMOGRAM  01/31/2024   Medicare Annual Wellness (AWV)  05/08/2024   Colonoscopy  08/26/2031   Pneumonia Vaccine 35+ Years old  Completed   INFLUENZA VACCINE  Completed   DEXA SCAN  Completed   Zoster Vaccines- Shingrix  Completed   HPV VACCINES  Aged Out    Health Maintenance  Health Maintenance Due  Topic Date Due   Hepatitis C Screening  Never done   DTaP/Tdap/Td (2 - Td or Tdap) 11/29/2018   COVID-19 Vaccine (6 - 2024-25 season) 10/17/2022   Health Maintenance Items Addressed: Referral sent to GI for colonoscopy, See Nurse Notes  Additional Screening:  Vision Screening: Recommended annual ophthalmology exams for early detection of glaucoma and other disorders of the eye.  Dental Screening: Recommended annual dental exams for proper oral hygiene  Community Resource Referral / Chronic Care Management: CRR required this visit?  No   CCM required this visit?  No     Plan:     I have personally reviewed and noted the following in the  patient's chart:   Medical and social history Use of alcohol, tobacco or illicit drugs  Current medications and supplements including opioid prescriptions. Patient is not currently taking opioid prescriptions. Functional ability and status Nutritional status Physical activity Advanced directives List of other physicians Hospitalizations, surgeries, and ER visits in previous 12 months Vitals Screenings to include cognitive, depression, and falls Referrals and appointments  In addition, I have reviewed and discussed with patient certain preventive protocols, quality metrics, and best practice recommendations. A written personalized care plan for preventive services as well as general preventive health recommendations were provided to patient.     Deonna Krummel L Hammad Finkler, CMA   05/09/2023   After Visit Summary: (MyChart) Due to this being a telephonic visit, the after visit summary with patients personalized plan was offered to patient via MyChart   Notes: Please refer to Routing Comments.

## 2023-05-09 NOTE — Patient Instructions (Addendum)
 Laura Hawkins , Thank you for taking time to come for your Medicare Wellness Visit. I appreciate your ongoing commitment to your health goals. Please review the following plan we discussed and let me know if I can assist you in the future.   Referrals/Orders/Follow-Ups/Clinician Recommendations: It was nice talking with you today.  Each day, aim for 6 glasses of water, plenty of protein in your diet and try to get up and walk/ stretch every hour for 5-10 minutes at a time.  You have an order for:   [x]   Bone Density     Please call for appointment:  [facility] [address] [place], [place] [address] [phone]   Make sure to wear two-piece clothing.  No lotions, powders, or deodorants the day of the appointment. Make sure to bring picture ID and insurance card.  Bring list of medications you are currently taking including any supplements.    This is a list of the screening recommended for you and due dates:  Health Maintenance  Topic Date Due   Hepatitis C Screening  Never done   DTaP/Tdap/Td vaccine (2 - Td or Tdap) 11/29/2018   COVID-19 Vaccine (6 - 2024-25 season) 10/17/2022   Mammogram  01/31/2024   Medicare Annual Wellness Visit  05/08/2024   Colon Cancer Screening  08/26/2031   Pneumonia Vaccine  Completed   Flu Shot  Completed   DEXA scan (bone density measurement)  Completed   Zoster (Shingles) Vaccine  Completed   HPV Vaccine  Aged Out    Advanced directives: (Declined) Advance directive discussed with you today. Even though you declined this today, please call our office should you change your mind, and we can give you the proper paperwork for you to fill out.  Next Medicare Annual Wellness Visit scheduled for next year: Yes

## 2023-05-10 NOTE — Addendum Note (Signed)
 Addended by: Wyvonne Lenz on: 05/10/2023 10:55 AM   Modules accepted: Orders

## 2023-05-28 IMAGING — MG MM DIGITAL SCREENING BILAT W/ TOMO AND CAD
6 of 12 series · 6 of 36 positions shown · non-contrast
Comparison: Previous exam(s).

CLINICAL DATA: Screening.

EXAM:
DIGITAL SCREENING BILATERAL MAMMOGRAM WITH TOMOSYNTHESIS AND CAD
TECHNIQUE: Bilateral screening digital craniocaudal and mediolateral oblique
mammograms were obtained. Bilateral screening digital breast
tomosynthesis was performed. The images were evaluated with
computer-aided detection.

[R CC synth-2D (1 of 2)]
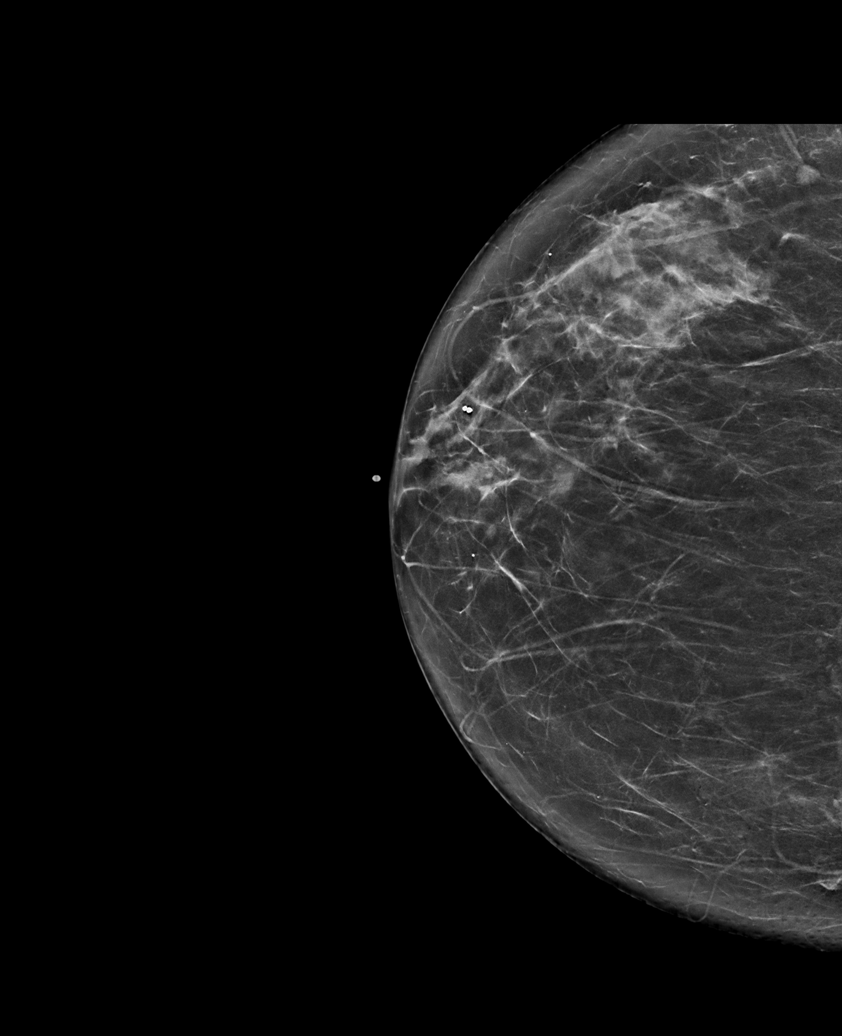

[R MLO synth-2D]
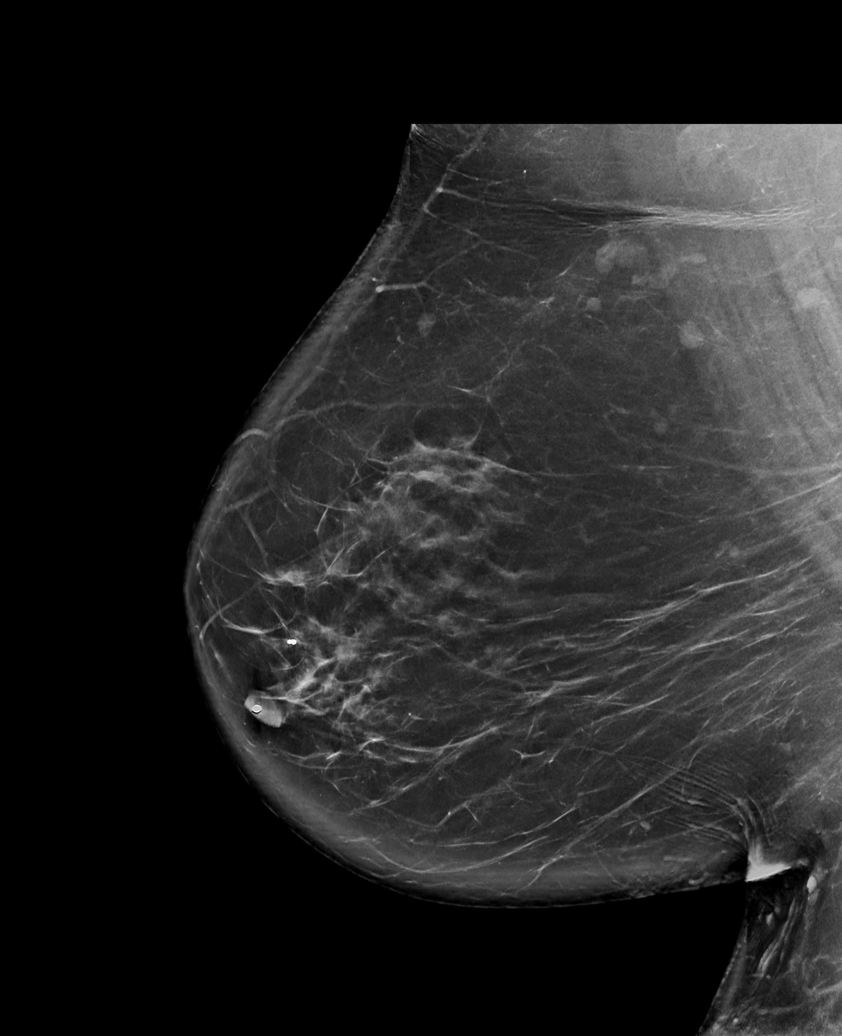

[L MLO synth-2D]
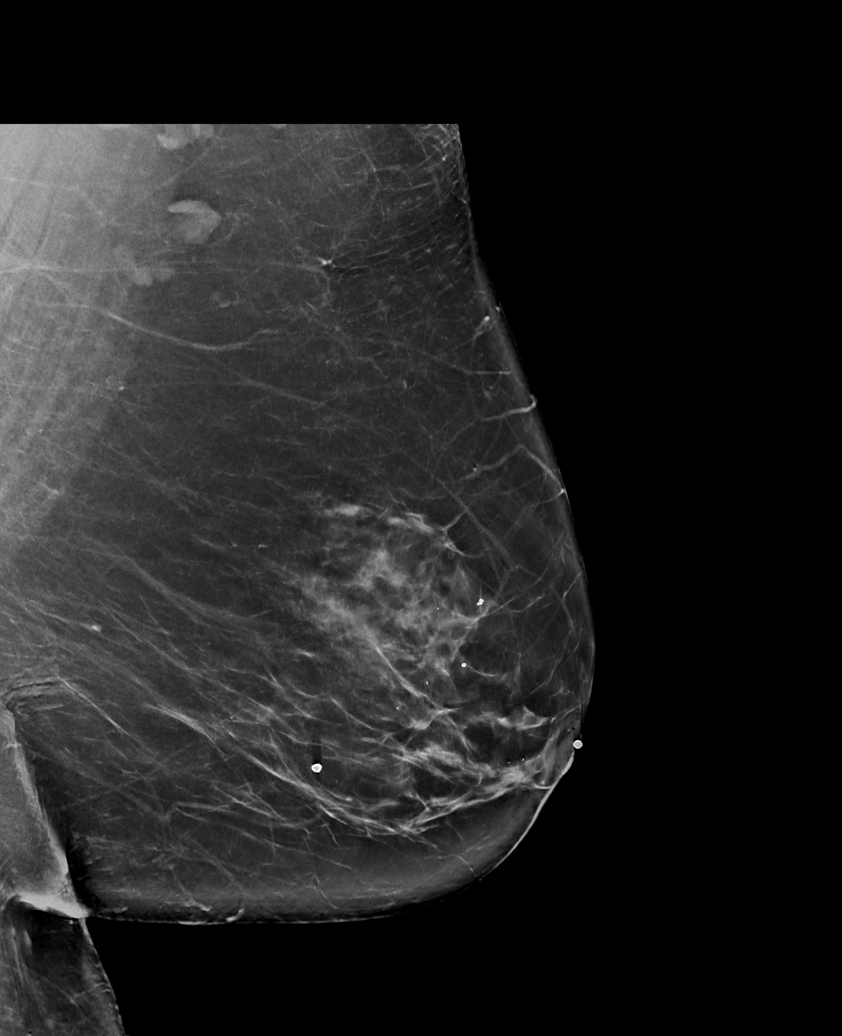

[L CC synth-2D]
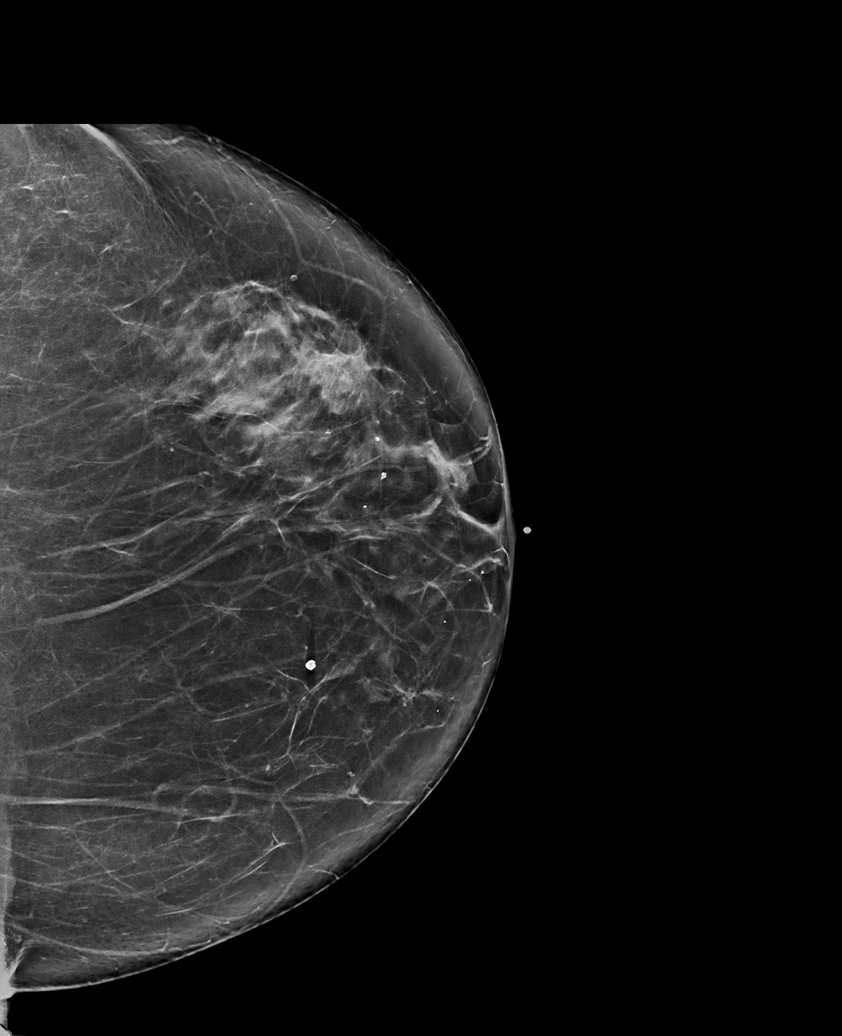

[R CC synth-2D (2 of 2)]
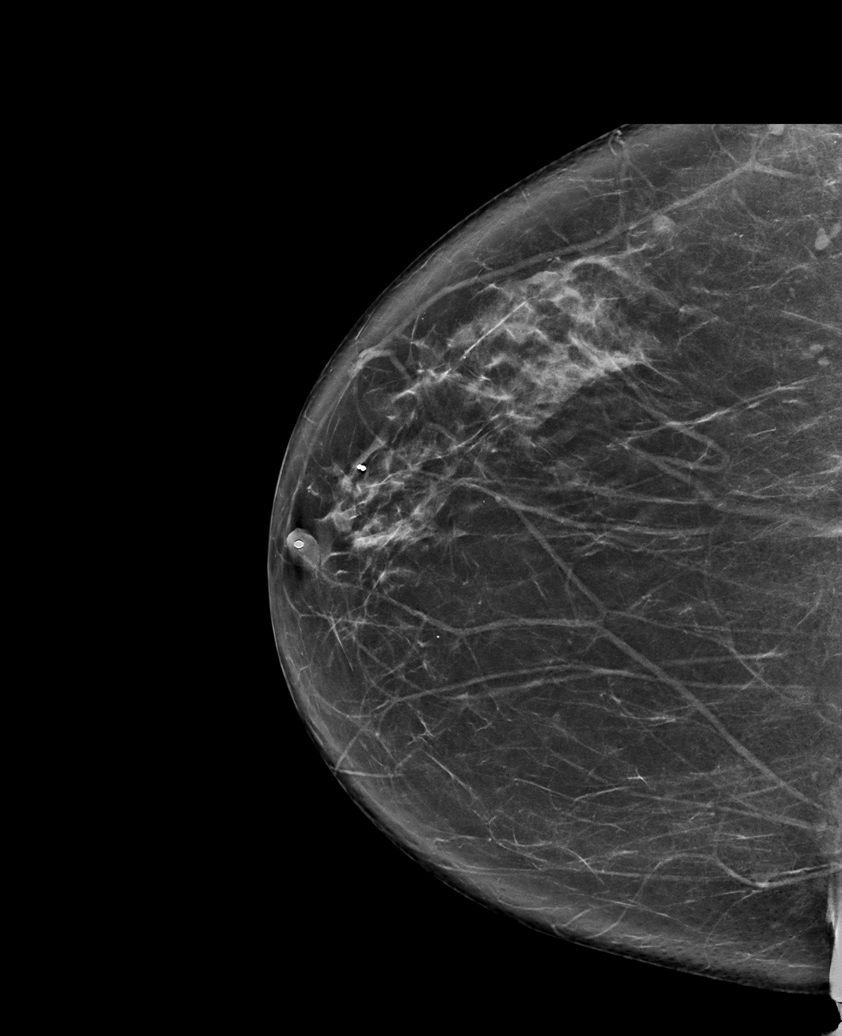

[R CV synth-2D]
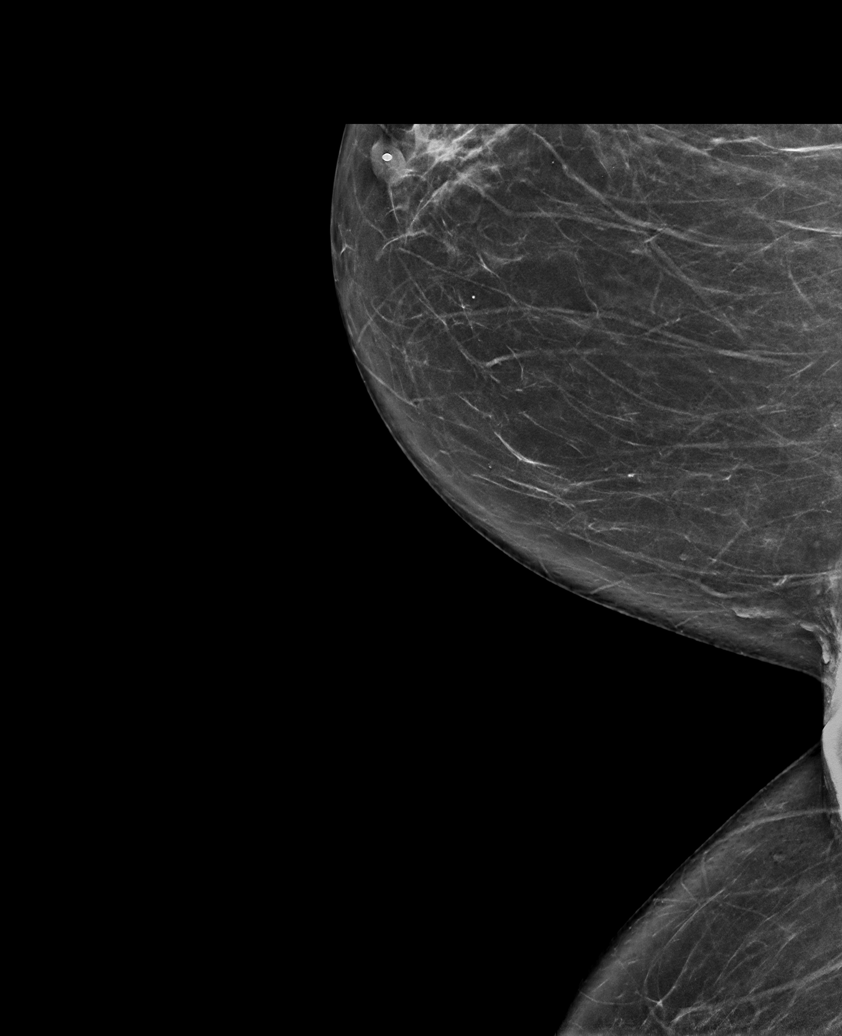

[6 of 36 positions shown; findings below may reference images not displayed]

ACR Breast Density Category c: The breast tissue is heterogeneously
dense, which may obscure small masses.
FINDINGS: There are no findings suspicious for malignancy.
IMPRESSION: No mammographic evidence of malignancy. A result letter of this
screening mammogram will be mailed directly to the patient.

RECOMMENDATION:
Screening mammogram in one year. (Code:Q3-W-BC3)

BI-RADS CATEGORY  1: Negative.

## 2023-06-15 ENCOUNTER — Ambulatory Visit (INDEPENDENT_AMBULATORY_CARE_PROVIDER_SITE_OTHER): Payer: Medicare Other | Admitting: Podiatry

## 2023-06-15 ENCOUNTER — Encounter: Payer: Self-pay | Admitting: Podiatry

## 2023-06-15 DIAGNOSIS — L608 Other nail disorders: Secondary | ICD-10-CM | POA: Diagnosis not present

## 2023-06-15 DIAGNOSIS — M79674 Pain in right toe(s): Secondary | ICD-10-CM | POA: Diagnosis not present

## 2023-06-15 DIAGNOSIS — M79675 Pain in left toe(s): Secondary | ICD-10-CM | POA: Diagnosis not present

## 2023-06-15 DIAGNOSIS — B351 Tinea unguium: Secondary | ICD-10-CM

## 2023-06-15 NOTE — Progress Notes (Signed)
 This patient returns to the office for evaluation and treatment of long thick painful nails .  This patient is unable to trim her own nails since the patient cannot reach her feet.  Patient says the nails are painful walking and wearing his shoes.  Sh e returns for preventive foot care services.  Patient asks for only dremel use on her big toenails.  General Appearance  Alert, conversant and in no acute stress.  Vascular  Dorsalis pedis and posterior tibial  pulses are palpable  bilaterally.  Capillary return is within normal limits  bilaterally. Temperature is within normal limits  bilaterally.  Neurologic  Senn-Weinstein monofilament wire test within normal limits  bilaterally. Muscle power within normal limits bilaterally.  Nails Thick disfigured discolored nails with subungual debris  from hallux to fifth toes bilaterally. No evidence of bacterial infection or drainage bilaterally.  Orthopedic  No limitations of motion  feet .  No crepitus or effusions noted.  No bony pathology or digital deformities noted.  Skin  normotropic skin with no porokeratosis noted bilaterally.  No signs of infections or ulcers noted.    Onychomycosis  Pain in toes right foot  Pain in toes left foot    Debridement  of nails  1-5  B/L with a nail nipper.  Nails were then filed using a dremel tool with no incidents.   RTC  10 weeks    Helane Gunther DPM

## 2023-06-29 ENCOUNTER — Other Ambulatory Visit (HOSPITAL_COMMUNITY): Payer: Self-pay

## 2023-06-29 ENCOUNTER — Encounter: Payer: Self-pay | Admitting: Internal Medicine

## 2023-06-29 ENCOUNTER — Ambulatory Visit: Payer: Medicare Other | Admitting: Internal Medicine

## 2023-06-29 ENCOUNTER — Other Ambulatory Visit: Payer: Self-pay | Admitting: Internal Medicine

## 2023-06-29 ENCOUNTER — Telehealth: Payer: Self-pay

## 2023-06-29 VITALS — BP 138/88 | HR 78 | Temp 98.5°F | Ht 65.0 in | Wt 248.0 lb

## 2023-06-29 DIAGNOSIS — R7303 Prediabetes: Secondary | ICD-10-CM

## 2023-06-29 DIAGNOSIS — I10 Essential (primary) hypertension: Secondary | ICD-10-CM | POA: Diagnosis not present

## 2023-06-29 DIAGNOSIS — E782 Mixed hyperlipidemia: Secondary | ICD-10-CM

## 2023-06-29 DIAGNOSIS — G4700 Insomnia, unspecified: Secondary | ICD-10-CM

## 2023-06-29 DIAGNOSIS — E039 Hypothyroidism, unspecified: Secondary | ICD-10-CM

## 2023-06-29 LAB — COMPREHENSIVE METABOLIC PANEL WITH GFR
ALT: 16 U/L (ref 0–35)
AST: 19 U/L (ref 0–37)
Albumin: 4.5 g/dL (ref 3.5–5.2)
Alkaline Phosphatase: 48 U/L (ref 39–117)
BUN: 27 mg/dL — ABNORMAL HIGH (ref 6–23)
CO2: 27 meq/L (ref 19–32)
Calcium: 9.5 mg/dL (ref 8.4–10.5)
Chloride: 104 meq/L (ref 96–112)
Creatinine, Ser: 1.12 mg/dL (ref 0.40–1.20)
GFR: 48.64 mL/min — ABNORMAL LOW (ref >60.00)
Glucose, Bld: 106 mg/dL — ABNORMAL HIGH (ref 70–99)
Potassium: 4.2 meq/L (ref 3.5–5.1)
Sodium: 139 meq/L (ref 135–145)
Total Bilirubin: 0.7 mg/dL (ref 0.2–1.2)
Total Protein: 6.9 g/dL (ref 6.0–8.3)

## 2023-06-29 LAB — CBC
HCT: 38.4 % (ref 36.0–46.0)
Hemoglobin: 13.1 g/dL (ref 12.0–15.0)
MCHC: 34.2 g/dL (ref 30.0–36.0)
MCV: 89.8 fl (ref 78.0–100.0)
Platelets: 206 10*3/uL (ref 150.0–400.0)
RBC: 4.27 Mil/uL (ref 3.87–5.11)
RDW: 13 % (ref 11.5–15.5)
WBC: 7.9 10*3/uL (ref 4.0–10.5)

## 2023-06-29 LAB — LIPID PANEL
Cholesterol: 165 mg/dL (ref 0–200)
HDL: 55.1 mg/dL (ref >39.00)
LDL Cholesterol: 87 mg/dL (ref 0–99)
NonHDL: 109.94
Total CHOL/HDL Ratio: 3
Triglycerides: 114 mg/dL (ref 0.0–149.0)
VLDL: 22.8 mg/dL (ref 0.0–40.0)

## 2023-06-29 LAB — TSH: TSH: 0.85 u[IU]/mL (ref 0.35–5.50)

## 2023-06-29 LAB — HEMOGLOBIN A1C: Hgb A1c MFr Bld: 5.6 % (ref 4.6–6.5)

## 2023-06-29 MED ORDER — SEMAGLUTIDE-WEIGHT MANAGEMENT 0.5 MG/0.5ML ~~LOC~~ SOAJ: 0.5000 mg | SUBCUTANEOUS | 0 refills | Status: AC

## 2023-06-29 MED ORDER — SEMAGLUTIDE-WEIGHT MANAGEMENT 2.4 MG/0.75ML ~~LOC~~ SOAJ: 2.4000 mg | SUBCUTANEOUS | 0 refills | Status: AC

## 2023-06-29 MED ORDER — SEMAGLUTIDE-WEIGHT MANAGEMENT 0.25 MG/0.5ML ~~LOC~~ SOAJ: 0.2500 mg | SUBCUTANEOUS | 0 refills | Status: AC

## 2023-06-29 MED ORDER — SEMAGLUTIDE-WEIGHT MANAGEMENT 1 MG/0.5ML ~~LOC~~ SOAJ: 1.0000 mg | SUBCUTANEOUS | 0 refills | Status: AC

## 2023-06-29 MED ORDER — SEMAGLUTIDE-WEIGHT MANAGEMENT 1.7 MG/0.75ML ~~LOC~~ SOAJ: 1.7000 mg | SUBCUTANEOUS | 0 refills | Status: AC

## 2023-06-29 NOTE — Progress Notes (Signed)
   Subjective:   Patient ID: Laura Hawkins, female    DOB: 1950-03-08, 73 y.o.   MRN: [id number]  HPI The patient is a 73 YO female coming in for medical management (see A/P for details).   Review of Systems  Constitutional: Negative.   HENT: Negative.    Eyes: Negative.   Respiratory:  Positive for shortness of breath. Negative for cough and chest tightness.        Stable, chronic  Cardiovascular:  Negative for chest pain, palpitations and leg swelling.  Gastrointestinal:  Negative for abdominal distention, abdominal pain, constipation, diarrhea, nausea and vomiting.  Musculoskeletal:  Positive for arthralgias and back pain.  Skin: Negative.   Neurological: Negative.   Psychiatric/Behavioral: Negative.      Objective:  Physical Exam Constitutional:      Appearance: She is well-developed. She is obese.  HENT:     Head: Normocephalic and atraumatic.  Cardiovascular:     Rate and Rhythm: Normal rate and regular rhythm.  Pulmonary:     Effort: Pulmonary effort is normal. No respiratory distress.     Breath sounds: Normal breath sounds. No wheezing or rales.  Abdominal:     General: Bowel sounds are normal. There is no distension.     Palpations: Abdomen is soft.     Tenderness: There is no abdominal tenderness. There is no rebound.  Musculoskeletal:        General: Tenderness present.     Cervical back: Normal range of motion.  Skin:    General: Skin is warm and dry.  Neurological:     Mental Status: She is alert and oriented to person, place, and time.     Coordination: Coordination normal.     Vitals:   06/29/23 1008 06/29/23 1025  BP: (!) 144/96 138/88  Pulse: 78   Temp: 98.5 F (36.9 C)   TempSrc: Oral   SpO2: 98%   Weight: 248 lb (112.5 kg)   Height: 5\' 5"  (1.651 m)     Assessment & Plan:

## 2023-06-29 NOTE — Assessment & Plan Note (Signed)
 Has made some modifications with less alcohol and no cats in the bedroom and is sleeping much better.

## 2023-06-29 NOTE — Assessment & Plan Note (Signed)
 Checking lipid panel and adjust pravastatin as needed.

## 2023-06-29 NOTE — Assessment & Plan Note (Signed)
 Checking TSH and adjust levothyroxine  112 mcg daily as needed.

## 2023-06-29 NOTE — Assessment & Plan Note (Signed)
 BP at goal on lisinopri/hydrochlorothiazide and checking CMP and adjust as needed.

## 2023-06-29 NOTE — Patient Instructions (Signed)
 We will check the labs today.

## 2023-06-29 NOTE — Telephone Encounter (Signed)
 Pharmacy Patient Advocate Encounter   Received notification from RX Request Messages that prior authorization for Wegovy  0.25mg /0.13ml is required/requested.   Insurance verification completed.   The patient is insured through [facility] .   Per test claim: Wegovy  is a plan/benefit exclusion.  Product/Service not covered.

## 2023-06-29 NOTE — Assessment & Plan Note (Signed)
 Rx wegovy  standard titration to help with weight loss.

## 2023-06-29 NOTE — Assessment & Plan Note (Signed)
 Checking HgA1c and adjust as needed.

## 2023-07-04 ENCOUNTER — Ambulatory Visit: Payer: Self-pay | Admitting: Internal Medicine

## 2023-07-08 ENCOUNTER — Other Ambulatory Visit: Payer: Self-pay | Admitting: Internal Medicine

## 2023-09-08 ENCOUNTER — Encounter: Payer: Self-pay | Admitting: Podiatry

## 2023-09-08 ENCOUNTER — Ambulatory Visit (INDEPENDENT_AMBULATORY_CARE_PROVIDER_SITE_OTHER): Payer: Medicare Other | Admitting: Podiatry

## 2023-09-08 DIAGNOSIS — M79674 Pain in right toe(s): Secondary | ICD-10-CM

## 2023-09-08 DIAGNOSIS — B351 Tinea unguium: Secondary | ICD-10-CM | POA: Diagnosis not present

## 2023-09-08 DIAGNOSIS — L608 Other nail disorders: Secondary | ICD-10-CM

## 2023-09-08 DIAGNOSIS — M79675 Pain in left toe(s): Secondary | ICD-10-CM

## 2023-09-08 NOTE — Progress Notes (Signed)
 This patient returns to the office for evaluation and treatment of long thick painful nails .  This patient is unable to trim her own nails since the patient cannot reach her feet.  Patient says the nails are painful walking and wearing his shoes.  Sh e returns for preventive foot care services.  Patient asks for only dremel use on her big toenails.  General Appearance  Alert, conversant and in no acute stress.  Vascular  Dorsalis pedis and posterior tibial  pulses are palpable  bilaterally.  Capillary return is within normal limits  bilaterally. Temperature is within normal limits  bilaterally.  Neurologic  Senn-Weinstein monofilament wire test within normal limits  bilaterally. Muscle power within normal limits bilaterally.  Nails Thick disfigured discolored nails with subungual debris  from hallux to fifth toes bilaterally. No evidence of bacterial infection or drainage bilaterally.  Orthopedic  No limitations of motion  feet .  No crepitus or effusions noted.  No bony pathology or digital deformities noted.  Skin  normotropic skin with no porokeratosis noted bilaterally.  No signs of infections or ulcers noted.    Onychomycosis  Pain in toes right foot  Pain in toes left foot    Debridement  of nails  1-5  B/L with a nail nipper.  Nails were then filed using a dremel tool with no incidents.   RTC  10 weeks    Helane Gunther DPM

## 2023-10-09 ENCOUNTER — Other Ambulatory Visit: Payer: Self-pay | Admitting: Internal Medicine

## 2023-10-24 ENCOUNTER — Encounter: Payer: Self-pay | Admitting: Internal Medicine

## 2023-10-24 ENCOUNTER — Other Ambulatory Visit (HOSPITAL_COMMUNITY): Payer: Self-pay

## 2023-11-22 ENCOUNTER — Encounter: Payer: Self-pay | Admitting: Internal Medicine

## 2023-11-23 ENCOUNTER — Encounter: Payer: Self-pay | Admitting: Podiatry

## 2023-11-23 ENCOUNTER — Ambulatory Visit: Admitting: Podiatry

## 2023-11-23 DIAGNOSIS — M79675 Pain in left toe(s): Secondary | ICD-10-CM | POA: Diagnosis not present

## 2023-11-23 DIAGNOSIS — L608 Other nail disorders: Secondary | ICD-10-CM

## 2023-11-23 DIAGNOSIS — B351 Tinea unguium: Secondary | ICD-10-CM

## 2023-11-23 DIAGNOSIS — M79674 Pain in right toe(s): Secondary | ICD-10-CM

## 2023-11-23 NOTE — Progress Notes (Signed)
 This patient returns to the office for evaluation and treatment of long thick painful nails .  This patient is unable to trim her own nails since the patient cannot reach her feet.  Patient says the nails are painful walking and wearing his shoes.  Sh e returns for preventive foot care services.  Patient asks for only dremel use on her big toenails.  General Appearance  Alert, conversant and in no acute stress.  Vascular  Dorsalis pedis and posterior tibial  pulses are palpable  bilaterally.  Capillary return is within normal limits  bilaterally. Temperature is within normal limits  bilaterally.  Neurologic  Senn-Weinstein monofilament wire test within normal limits  bilaterally. Muscle power within normal limits bilaterally.  Nails Thick disfigured discolored nails with subungual debris  from hallux to fifth toes bilaterally. No evidence of bacterial infection or drainage bilaterally.  Orthopedic  No limitations of motion  feet .  No crepitus or effusions noted.  No bony pathology or digital deformities noted.  Skin  normotropic skin with no porokeratosis noted bilaterally.  No signs of infections or ulcers noted.    Onychomycosis  Pain in toes right foot  Pain in toes left foot    Debridement  of nails  1-5  B/L with a nail nipper.  Nails were then filed using a dremel tool with no incidents.   RTC  10 weeks    Helane Gunther DPM

## 2023-12-05 ENCOUNTER — Encounter: Payer: Self-pay | Admitting: Internal Medicine

## 2023-12-05 NOTE — Telephone Encounter (Signed)
**Note De-identified  [place]bright Obfuscation** Please advise 

## 2023-12-08 ENCOUNTER — Ambulatory Visit (HOSPITAL_BASED_OUTPATIENT_CLINIC_OR_DEPARTMENT_OTHER)
Admission: RE | Admit: 2023-12-08 | Discharge: 2023-12-08 | Disposition: A | Discharge status: Home / Self Care | Source: Ambulatory Visit | Attending: Internal Medicine | Admitting: Internal Medicine

## 2023-12-08 DIAGNOSIS — Z780 Asymptomatic menopausal state: Secondary | ICD-10-CM | POA: Insufficient documentation

## 2023-12-08 DIAGNOSIS — M8580 Other specified disorders of bone density and structure, unspecified site: Secondary | ICD-10-CM | POA: Insufficient documentation

## 2023-12-09 ENCOUNTER — Ambulatory Visit: Payer: Self-pay | Admitting: Internal Medicine

## 2023-12-14 ENCOUNTER — Other Ambulatory Visit: Payer: Self-pay | Admitting: Internal Medicine

## 2023-12-30 ENCOUNTER — Encounter: Payer: Self-pay | Admitting: Internal Medicine

## 2023-12-30 ENCOUNTER — Ambulatory Visit: Admitting: Internal Medicine

## 2023-12-30 ENCOUNTER — Other Ambulatory Visit: Payer: Self-pay | Admitting: Internal Medicine

## 2023-12-30 VITALS — BP 120/80 | HR 76 | Temp 98.3°F | Ht 64.0 in | Wt 240.0 lb

## 2023-12-30 DIAGNOSIS — G4733 Obstructive sleep apnea (adult) (pediatric): Secondary | ICD-10-CM

## 2023-12-30 DIAGNOSIS — K429 Umbilical hernia without obstruction or gangrene: Secondary | ICD-10-CM

## 2023-12-30 DIAGNOSIS — K603 Anal fistula, unspecified: Secondary | ICD-10-CM | POA: Diagnosis not present

## 2023-12-30 DIAGNOSIS — M7021 Olecranon bursitis, right elbow: Secondary | ICD-10-CM | POA: Diagnosis not present

## 2023-12-30 MED ORDER — ZEPBOUND 5 MG/0.5ML ~~LOC~~ SOAJ: 5.0000 mg | SUBCUTANEOUS | 0 refills | Status: AC

## 2023-12-30 MED ORDER — ZEPBOUND 7.5 MG/0.5ML ~~LOC~~ SOAJ: 7.5000 mg | SUBCUTANEOUS | 0 refills | Status: AC

## 2023-12-30 MED ORDER — ZEPBOUND 2.5 MG/0.5ML ~~LOC~~ SOAJ: 2.5000 mg | SUBCUTANEOUS | 0 refills | Status: AC

## 2023-12-30 MED ORDER — ZEPBOUND 10 MG/0.5ML ~~LOC~~ SOAJ: 10.0000 mg | SUBCUTANEOUS | 0 refills | Status: AC

## 2023-12-30 NOTE — Patient Instructions (Signed)
 We will get you in with the surgeon for the elbow.   We will send in the zepbound to see if they cover it and have ordered a new CPAP machine for you.

## 2023-12-30 NOTE — Assessment & Plan Note (Signed)
 She recently lost 15 pounds through diet and is interested in pharmacological weight loss due to sleep apnea. Insurance coverage may depend on the severity of sleep apnea. Prescribed Zepbound for weight management.

## 2023-12-30 NOTE — Assessment & Plan Note (Signed)
 She has a small, non-painful hernia. No intervention is needed unless it becomes symptomatic or enlarges.

## 2023-12-30 NOTE — Progress Notes (Signed)
 Subjective:   Patient ID: Laura Hawkins, female    DOB: 06-09-1950, 73 y.o.   MRN: [id number]  Discussed the use of AI scribe software for clinical note transcription with the patient, who gave verbal consent to proceed.  History of Present Illness Laura Hawkins is a 73 year old female who presents with a large mass near her elbow and concerns about her CPAP machine.  She has a large mass near her elbow that has been present for a couple of months. She initially thought it was a fatty tumor (lipoma), but describes this mass as much bigger and says it has been gradually getting larger. It frequently gets hit, causing irritation and re-injury, but it is not painful unless directly impacted. She describes the mass as 'ugly' and 'very large'.  She mentions discovering a hernia while sitting, which is soft and not painful. It does not cause her discomfort or impede her daily activities.  She discusses her CPAP machine, which she has been using every night for four years. She reports issues with the machine running either too hot or too cold, despite changing all the equipment. She feels the machine is worn out and needs a replacement. She emphasizes her dependence on the CPAP for sleep and notes that it has significantly reduced her snoring, improving her husband's sleep as well.  No new chest pain, tightness, pressure, or breathing problems. She mentions a history of an anal fistula during the COVID lockdown, which was operated on after a delay, resulting in ongoing issues with fecal incontinence and the need to change pads regularly. This condition has not changed recently.  She has been working on weight loss and has lost about fifteen pounds by cutting back on certain foods. She wants medication to assist with further weight loss, as previous attempts to obtain such medication have been denied. She believes that losing more weight would enable her to exercise more effectively.  Review of Systems   Constitutional: Negative.   HENT: Negative.    Eyes: Negative.   Respiratory:  Negative for cough, chest tightness and shortness of breath.   Cardiovascular:  Negative for chest pain, palpitations and leg swelling.  Gastrointestinal:  Negative for abdominal distention, abdominal pain, constipation, diarrhea, nausea and vomiting.  Musculoskeletal: Negative.   Skin: Negative.        Mass near right elbow  Neurological: Negative.   Psychiatric/Behavioral: Negative.      Objective:  Physical Exam Constitutional:      Appearance: She is well-developed. She is obese.  HENT:     Head: Normocephalic and atraumatic.  Cardiovascular:     Rate and Rhythm: Normal rate and regular rhythm.  Pulmonary:     Effort: Pulmonary effort is normal. No respiratory distress.     Breath sounds: Normal breath sounds. No wheezing or rales.  Abdominal:     General: Bowel sounds are normal. There is no distension.     Palpations: Abdomen is soft.     Tenderness: There is no abdominal tenderness.  Musculoskeletal:     Cervical back: Normal range of motion.     Comments: Right olecranon bursa enlarged about size baseball  Skin:    General: Skin is warm and dry.  Neurological:     Mental Status: She is alert and oriented to person, place, and time.     Coordination: Coordination normal.     Vitals:   12/30/23 1016  BP: 120/80  Pulse: 76  Temp: 98.3 F (36.8 C)  TempSrc: Oral  SpO2: 97%  Weight: 240 lb (108.9 kg)  Height: 5' 4 (1.626 m)    Assessment and Plan Assessment & Plan Right olecranon bursitis   She has a chronic, non-painful, unsightly bursa sac likely due to trauma, prone to refilling. Surgical removal is the definitive treatment, which she prefers to undergo after the new year. Referred to an orthopedic surgeon for surgical removal.  Obstructive sleep apnea   Managed with CPAP, but the current machine is malfunctioning and causing discomfort. There is significant improvement in  sleep quality and snoring with its use. Ordered a new CPAP machine through a home health company.  Unilateral umbilical hernia without obstruction or gangrene   She has a small, non-painful hernia. No intervention is needed unless it becomes symptomatic or enlarges.  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 There are no new or worsening symptoms.  Morbid obesity She recently lost 15 pounds through diet and is interested in pharmacological weight loss due to sleep apnea. Insurance coverage may depend on the severity of sleep apnea. Prescribed Zepbound for weight management.

## 2023-12-30 NOTE — Assessment & Plan Note (Signed)
 She has a chronic, non-painful, unsightly bursa sac likely due to trauma, prone to refilling. Surgical removal is the definitive treatment, which she prefers to undergo after the new year. Referred to an orthopedic surgeon for surgical removal.

## 2023-12-30 NOTE — Assessment & Plan Note (Signed)
 On CPAP and needs new machine which is ordered today.

## 2023-12-30 NOTE — Assessment & Plan Note (Signed)
 Chronic incontinence persists post-surgery, managed with pads. There are no new or worsening symptoms.

## 2024-01-05 ENCOUNTER — Other Ambulatory Visit: Payer: Self-pay | Admitting: Internal Medicine

## 2024-01-12 ENCOUNTER — Telehealth: Admitting: Emergency Medicine

## 2024-01-12 DIAGNOSIS — L03113 Cellulitis of right upper limb: Secondary | ICD-10-CM

## 2024-01-12 DIAGNOSIS — M7021 Olecranon bursitis, right elbow: Secondary | ICD-10-CM

## 2024-01-12 MED ORDER — CLINDAMYCIN HCL 300 MG PO CAPS: 300.0000 mg | ORAL_CAPSULE | Freq: Four times a day (QID) | ORAL | 0 refills | Status: AC

## 2024-01-12 NOTE — Patient Instructions (Signed)
 Sherrell Barters, thank you for joining Jon CHRISTELLA Belt, NP for today's virtual visit.  While this provider is not your primary care provider (PCP), if your PCP is located in our provider database this encounter information will be shared with them immediately following your visit.   A [facility] MyChart account gives you access to today's visit and all your visits, tests, and labs performed at [facility]  click here if you don't have a [facility] MyChart account or go to [email or web address]  Consent: (Patient) Laura Hawkins provided verbal consent for this virtual visit at the beginning of the encounter.  Current Medications:  Current Outpatient Medications:    clindamycin  (CLEOCIN ) 300 MG capsule, Take 1 capsule (300 mg total) by mouth 4 (four) times daily for 10 days., Disp: 40 capsule, Rfl: 0   B Complex Vitamins (VITAMIN B COMPLEX) TABS, Take by mouth daily., Disp: , Rfl:    Calcium-Magnesium-Vitamin D (CALCIUM MAGNESIUM PO), Take by mouth daily., Disp: , Rfl:    Cholecalciferol 25 MCG (1000 UT) capsule, Take 2,000 Units by mouth daily., Disp: , Rfl:    clobetasol (TEMOVATE) 0.05 % external solution, Apply topically., Disp: , Rfl:    diltiazem  (CARDIZEM  CD) 120 MG 24 hr capsule, Take 120 mg by mouth daily., Disp: , Rfl:    ibuprofen (ADVIL) 200 MG tablet, Take 200 mg by mouth every 6 (six) hours as needed., Disp: , Rfl:    levothyroxine  (SYNTHROID ) 112 MCG tablet, TAKE 1 TABLET BY MOUTH EVERY DAY BEFORE BREAKFAST, Disp: 90 tablet, Rfl: 3   lisinopril-hydrochlorothiazide (ZESTORETIC) 20-25 MG tablet, TAKE 1 TABLET BY MOUTH EVERY DAY, Disp: 90 tablet, Rfl: 1   meloxicam  (MOBIC ) 15 MG tablet, TAKE 1 TABLET (15 MG TOTAL) BY MOUTH DAILY., Disp: 90 tablet, Rfl: 1   Omega-3 Fatty Acids (FISH OIL) 1000 MG CAPS, Take by mouth., Disp: , Rfl:    pantoprazole  (PROTONIX ) 40 MG tablet, TAKE 1 TABLET BY MOUTH EVERY DAY, Disp: 90 tablet, Rfl: 1   pravastatin  (PRAVACHOL ) 80 MG tablet, TAKE  1 TABLET BY MOUTH EVERY DAY, Disp: 90 tablet, Rfl: 1   TIADYLT  ER 120 MG 24 hr capsule, TAKE 1 CAPSULE BY MOUTH EVERY DAY, Disp: 90 capsule, Rfl: 3   tirzepatide  (ZEPBOUND ) 10 MG/0.5ML Pen, Inject 10 mg into the skin once a week., Disp: 2 mL, Rfl: 0   tirzepatide  (ZEPBOUND ) 2.5 MG/0.5ML Pen, Inject 2.5 mg into the skin once a week., Disp: 2 mL, Rfl: 0   tirzepatide  (ZEPBOUND ) 5 MG/0.5ML Pen, Inject 5 mg into the skin once a week., Disp: 2 mL, Rfl: 0   tirzepatide  (ZEPBOUND ) 7.5 MG/0.5ML Pen, Inject 7.5 mg into the skin once a week., Disp: 2 mL, Rfl: 0   traZODone  (DESYREL ) 100 MG tablet, TAKE 1 TABLET BY MOUTH EVERYDAY AT BEDTIME, Disp: 90 tablet, Rfl: 3   Medications ordered in this encounter:  Meds ordered this encounter  Medications   clindamycin  (CLEOCIN ) 300 MG capsule    Sig: Take 1 capsule (300 mg total) by mouth 4 (four) times daily for 10 days.    Dispense:  40 capsule    Refill:  0     *If you need refills on other medications prior to your next appointment, please contact your pharmacy*  Follow-Up: Call back or seek an in-person evaluation if the symptoms worsen or if the condition fails to improve as anticipated.  [facility] [phone]  Other Instructions  If your elbow wound starts draining pus  or is becoming significantly more inflamed and painful, or if you develop a fever and feel very sick, please go to ER.    If you have been instructed to have an in-person evaluation today at a local Urgent Care facility, please use the link below. It will take you to a list of all of our available [facility], including address, phone number and hours of operation. Please do not delay care.  [facility]  If you or a family member do not have a primary care provider, use the link below to schedule a visit and establish care. When you choose a [facility] primary care physician or advanced practice provider, you gain a long-term partner in  health. Find a Primary Care Provider  Learn more about [facility]'s in-office and virtual care options: [facility] - Get Care Now

## 2024-01-12 NOTE — Progress Notes (Signed)
 Virtual Visit Consent   Laura Hawkins, you are scheduled for a virtual visit with a [facility] provider today. Just as with appointments in the office, your consent must be obtained to participate. Your consent will be active for this visit and any virtual visit you may have with one of our providers in the next 365 days. If you have a MyChart account, a copy of this consent can be sent to you electronically.  As this is a virtual visit, video technology does not allow for your provider to perform a traditional examination. This may limit your provider's ability to fully assess your condition. If your provider identifies any concerns that need to be evaluated in person or the need to arrange testing (such as labs, EKG, etc.), we will make arrangements to do so. Although advances in technology are sophisticated, we cannot ensure that it will always work on either your end or our end. If the connection with a video visit is poor, the visit may have to be switched to a telephone visit. With either a video or telephone visit, we are not always able to ensure that we have a secure connection.  By engaging in this virtual visit, you consent to the provision of healthcare and authorize for your insurance to be billed (if applicable) for the services provided during this visit. Depending on your insurance coverage, you may receive a charge related to this service.  I need to obtain your verbal consent now. Are you willing to proceed with your visit today? Laura Hawkins has provided verbal consent on 01/12/2024 for a virtual visit (video or telephone). Jon CHRISTELLA Belt, NP  Date: 01/12/2024 12:31 PM   Virtual Visit via Video Note   I, Jon CHRISTELLA Belt, connected with  Laura Hawkins  ([id number], 31-Dec-1950) on 01/12/24 at 12:30 PM EST by a video-enabled telemedicine application and verified that I am speaking with the correct person using two identifiers.  Location: Patient: Virtual Visit Location Patient:  Home Provider: Virtual Visit Location Provider: Home Office   I discussed the limitations of evaluation and management by telemedicine and the availability of in person appointments. The patient expressed understanding and agreed to proceed.    History of Present Illness: Laura Hawkins is a 73 y.o. who identifies as a female who was assigned female at birth, and is being seen today for ruptured olecranon bursa on R elbow. Had had large, size of orange, enlarged bursa for a long time. Rupture happened 2 nights ago, was in kitchen, rested elbow on counter, and then noticed water/fluid on nightgown. Fluid draining is clear, slightly yellow, slightly bloody. Skin around open wound is red and warm to the touch. Mildly painful. No hx cellulitis or MRSA. No fever - temp was normal; does not feel ill.   Has appt with ortho to discuss having enlarged bursa removed at end of December.   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   OSA (obstructive sleep apnea) 12/14/2019   Anal fistula 07/18/2018   DDD (degenerative disc disease), lumbar 07/05/2018   History of colonic polyps 07/05/2018   Osteopenia 07/05/2018  Other hemorrhoids 07/05/2018   Gastro-esophageal reflux disease without esophagitis 03/23/2013   Unspecified hearing loss, unspecified ear 03/23/2013   Prediabetes 08/01/2012    Allergies:  Allergies  Allergen Reactions   Penicillins Anaphylaxis, Shortness Of Breath and Swelling    Throat swelling   Poison Ivy Extract Rash   Poison Oak Extract Rash   Bee Venom     Bee stings, swelling  and turns red    Medications:  Current Outpatient Medications:    clindamycin  (CLEOCIN ) 300 MG capsule, Take 1 capsule (300 mg total) by mouth 4 (four) times daily for 10 days., Disp: 40 capsule, Rfl: 0   B Complex Vitamins (VITAMIN B COMPLEX) TABS, Take by mouth daily., Disp: , Rfl:    Calcium-Magnesium-Vitamin D (CALCIUM MAGNESIUM PO), Take by mouth daily., Disp: , Rfl:    Cholecalciferol 25 MCG (1000 UT) capsule, Take 2,000 Units by mouth daily., Disp: , Rfl:    clobetasol (TEMOVATE) 0.05 % external solution, Apply topically., Disp: , Rfl:    diltiazem  (CARDIZEM  CD) 120 MG 24 hr capsule, Take 120 mg by mouth daily., Disp: , Rfl:    ibuprofen (ADVIL) 200 MG tablet, Take 200 mg by mouth every 6 (six) hours as needed., Disp: , Rfl:    levothyroxine  (SYNTHROID ) 112 MCG tablet, TAKE 1 TABLET BY MOUTH EVERY DAY BEFORE BREAKFAST, Disp: 90 tablet, Rfl: 3   lisinopril-hydrochlorothiazide (ZESTORETIC) 20-25 MG tablet, TAKE 1 TABLET BY MOUTH EVERY DAY, Disp: 90 tablet, Rfl: 1   meloxicam  (MOBIC ) 15 MG tablet, TAKE 1 TABLET (15 MG TOTAL) BY MOUTH DAILY., Disp: 90 tablet, Rfl: 1   Omega-3 Fatty Acids (FISH OIL) 1000 MG CAPS, Take by mouth., Disp: , Rfl:    pantoprazole  (PROTONIX ) 40 MG tablet, TAKE 1 TABLET BY MOUTH EVERY DAY, Disp: 90 tablet, Rfl: 1   pravastatin  (PRAVACHOL ) 80 MG tablet, TAKE 1 TABLET BY MOUTH EVERY DAY, Disp: 90 tablet, Rfl: 1   TIADYLT  ER 120 MG 24 hr capsule, TAKE 1 CAPSULE BY MOUTH EVERY DAY, Disp: 90 capsule, Rfl: 3   tirzepatide  (ZEPBOUND ) 10 MG/0.5ML Pen, Inject 10 mg into the skin once a week., Disp: 2 mL, Rfl: 0   tirzepatide  (ZEPBOUND ) 2.5 MG/0.5ML Pen, Inject 2.5 mg into the skin once a week., Disp: 2 mL, Rfl: 0   tirzepatide  (ZEPBOUND ) 5 MG/0.5ML Pen, Inject 5 mg into the skin once a week., Disp: 2 mL, Rfl: 0   tirzepatide  (ZEPBOUND ) 7.5 MG/0.5ML Pen, Inject 7.5 mg into the skin once a week., Disp: 2 mL, Rfl: 0   traZODone  (DESYREL ) 100 MG tablet, TAKE 1 TABLET BY MOUTH  EVERYDAY AT BEDTIME, Disp: 90 tablet, Rfl: 3  Observations/Objective: Patient is well-developed, well-nourished in no acute distress.  Resting comfortably  at home.  Head is normocephalic, atraumatic.  No labored breathing.  Speech is clear and coherent with logical content.  Patient is alert and oriented at baseline.  Skin of R elbow is erytematous. It is hard to judge size on video, but I estimate an open wound around size of a nickel on R elbow, no active bleeding.   Assessment and Plan: 1. Olecranon bursitis, right elbow (Primary)  2. Cellulitis of right upper extremity - clindamycin  (CLEOCIN ) 300 MG capsule; Take 1 capsule (300 mg total) by mouth 4 (four) times daily for 10 days.  Dispense: 40 capsule; Refill: 0  Concern for possible cellulitis of ruptured R elbow bursa. She is allergic to pcn. Will treat with clindamycin .   Follow Up Instructions:  I discussed the assessment and treatment plan with the patient. The patient was provided an opportunity to ask questions and all were answered. The patient agreed with the plan and demonstrated an understanding of the instructions.  A copy of instructions were sent to the patient via MyChart unless otherwise noted below.   The patient was advised to call back or seek an in-person evaluation if the symptoms worsen or if the condition fails to improve as anticipated.    Jon CHRISTELLA Belt, NP

## 2024-01-16 ENCOUNTER — Encounter: Payer: Self-pay | Admitting: Internal Medicine

## 2024-01-20 ENCOUNTER — Other Ambulatory Visit

## 2024-01-24 ENCOUNTER — Other Ambulatory Visit: Payer: Self-pay

## 2024-01-24 ENCOUNTER — Encounter: Payer: Self-pay | Admitting: Sports Medicine

## 2024-01-24 ENCOUNTER — Ambulatory Visit: Admitting: Sports Medicine

## 2024-01-24 DIAGNOSIS — M25521 Pain in right elbow: Secondary | ICD-10-CM

## 2024-01-24 DIAGNOSIS — L03113 Cellulitis of right upper limb: Secondary | ICD-10-CM

## 2024-01-24 DIAGNOSIS — M7021 Olecranon bursitis, right elbow: Secondary | ICD-10-CM

## 2024-01-24 NOTE — Progress Notes (Signed)
 Laura Hawkins - 73 y.o. female MRN [id number]  Date of birth: 06/23/1950  Office Visit Note: Visit Date: 01/24/2024 PCP: Rollene Almarie LABOR, MD Referred by: Rollene Almarie LABOR, *  Medical Resident, Sports Medicine Fellow - Attending Physician Addendum:   I have independently interviewed and examined the patient myself. I have discussed the above with the original author and agree with their documentation. My edits for correction/addition/clarification have been made, see any changes above and below.   In summary, right elbow with previous large-volume olecranon bursitis (likely traumatic after bumping elbow) in November that ruptured recently and has been mildly draining since that time.  Did see [facility] virtual physician on 01/12/24 who placed her on clindamycin  4 times daily x 10 days, patient just completed yesterday. Has never had fever/chills, significant redness or cellulitis of the elbow, so likely previous aseptic olecranon bursitis that ruptured and still continues to give patient discomfort. She is bothered with pressure on elbow and appearance.  Ideally I would have liked to aspirate the fluid to send off and/or obtain labs including CBC, CRP/ESR to completely rule out infectious etiology, but no role for this currently given just completed 10-day Abx course.  I will get her to see Dr. Jerri over the next 10 to 14 days for consideration of bursectomy versus possible washout, we will see if there is any fluid accumulation and/or any signs of cellulitis/infection after her brief antibiotic washout when she sees him in follow-up.  Recommended elbow compression sleeve with padding to avoid contact/irritation. Strict Return precautions provided.  Lonell Sprang, DO Primary Care Sports Medicine Physician  [facility] - Orthopedics   Subjective: Chief Complaint  Patient presents with   Right Elbow - Pain   HPI: Laura Hawkins is a pleasant 73 y.o. female who presents today for  further evaluation of right elbow swelling and drainage.  Patient states roughly 3 months ago she was working in her kitchen and she hit her right elbow hard on the kitchen sink.  Did endorse some immediate swelling after injury occurred but thought it would go away on her own and did not immediately seek treatment.  However swelling persisted so after waiting a couple months she eventually saw her internal medicine physician on 12/30/2023.  Patient was diagnosed with olecranon bursitis at this time and referred to orthopedic for further management of care and consideration of bursectomy.  Patient was not placed on any medication or any additional treatment at that time.  Two weeks after initial visit with PCP patient states her large orange sized bursitis ruptures and over a cup of clear yellow fluid drained from wound.  She had a video visit with an urgent care provider on Thanksgiving day who placed her on 10 days of clindamycin .  She just recently completed her last day of antibiotics.  She denies any worsening of pain either before or after antibiotic.  She has continued to have daily drainage of clear straw-colored fluid from her right posterior elbow.  Denies any proximally spreading erythema.  Denies any fevers or chills.  Has been wearing absorptive padding over her elbow so it does not leak throughout the day.  Still is tender to palpation if she accidentally bumps her elbow when completing household tasks.  Pertinent ROS were reviewed with the patient and found to be negative unless otherwise specified above in HPI.   Assessment & Plan: Visit Diagnoses:  1. Pain in right elbow   2. Olecranon bursitis, right elbow  3. Cellulitis of right elbow    Plan: HPI and physical exam consistent with presumed diagnosis of right olecranon bursitis however ultrasonic evaluation shows more widespread diffuse soft tissue swelling over posterior olecranon that slightly resembles cobblestoning of cellulitis.   While I still believe this is likely chronic induration related to bursitis and not an infectious process we will closely monitor for any changes. Will have her see Dr. Jerri in next 10-14 days for evaluation and consideration of bursectomy vs. Washout, depending on appearance and/or fluid reaccumulation after antibiotic holiday. Return precautions provided.  Follow-up: Return for Appt with Dr. Jerri for R-elbow Olec bursa/cellulitis in 10-14 days.   Meds & Orders: No orders of the defined types were placed in this encounter.   Orders Placed This Encounter  Procedures   XR Elbow 2 Views Right   US  Extrem Up Right Ltd     Procedures: No procedures performed      Clinical History: No specialty comments available.  She reports that she has quit smoking. She has never used smokeless tobacco.  Recent Labs    06/29/23 1030  HGBA1C 5.6    Objective:    Physical Exam  Gen: Well-appearing, in no acute distress; non-toxic CV: Well-perfused. Warm.  Resp: Breathing unlabored on room air; no wheezing. Psych: Fluid speech in conversation; appropriate affect; normal thought process  Ortho Exam - Right elbow: There is prominence of the olecranon bursa sac without true palpable olecranon bursitis today.  There is a small punctate wound that does have active clear yellow drainage present.  No surrounding cellulitic change that extends past the elbow joint.  Full range of motion with flexion and extension.  Largely nontender.  Imaging: US  Extrem Up Right Ltd Result Date: 01/24/2024 Limited musculoskeletal ultrasound of the right elbow was performed today.  Evaluation of the olecranon shows no cortical regularity.  There is a small recess where the olecranon bursa does live but there is no well-circumscribed fluid collection.  There is mixed isoechoic change in the surrounding soft tissue with evidence of cobblestoning suggestive of possible soft tissue cellulitic change. Triceps tendon insertion intact  without tearing.  XR Elbow 2 Views Right Result Date: 01/24/2024 2 views of the right elbow including AP and lateral film were ordered and reviewed by myself today.  No acute fracture noted.  There is notable callus and bony osteophytosis with elbow joint arthritic change, appears posttraumatic vs. Previous surgical in nature.    Past Medical/Family/Surgical/Social History: Medications & Allergies reviewed per EMR, new medications updated. 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   OSA (obstructive sleep apnea) 12/14/2019   Anal fistula 07/18/2018   DDD (degenerative disc disease), lumbar 07/05/2018   History of colonic polyps 07/05/2018   Osteopenia 07/05/2018   Other hemorrhoids 07/05/2018   Gastro-esophageal reflux disease without esophagitis 03/23/2013   Unspecified hearing loss, unspecified ear 03/23/2013   Prediabetes 08/01/2012   Past Medical History:  Diagnosis Date   GERD (gastroesophageal reflux disease) 05/1990   on meds   Hyperlipidemia 1992   on meds   Hypertension 1995   on meds   Neuromuscular disorder (HCC) 2015  Sleep apnea 2008   uses CPAP   Thyroid  disease 1992   on meds   Family History  Problem Relation Age of Onset   Dementia Mother    Arthritis Mother    Hearing loss Mother    Varicose Veins Mother    Pancreatitis Father    Diabetes Father    Early death Father    Pancreatic cancer Brother    Cancer Brother    Hearing loss Maternal Grandfather    Diabetes Paternal Grandmother    Colon cancer Neg Hx    Colon polyps Neg Hx    Esophageal cancer Neg Hx    Stomach  cancer Neg Hx    Rectal cancer Neg Hx    Past Surgical History:  Procedure Laterality Date   COLONOSCOPY  2013   [place] - normal -1 polyp- 10 yr follow   dislocated right elbow Right 2013   FRACTURE SURGERY  2000   Shattered my wrist in an accident with my dogs   HAMMER TOE SURGERY Right    TREATMENT FISTULA ANAL Left 2020   WISDOM TOOTH EXTRACTION     WRIST FRACTURE SURGERY Left 2001   Social History   Occupational History   Occupation: RETIRED  Tobacco Use   Smoking status: Former   Smokeless tobacco: Never  Advertising Account Pl[employer]   Vaping status: Never Used  Substance and Sexual Activity   Alcohol use: Not Currently    Alcohol/week: 0.0 - 7.0 standard drinks of alcohol    Comment: Social   Drug use: Never   Sexual activity: Yes    Partners: Male    Birth control/protection: Post-menopausal

## 2024-01-24 NOTE — Progress Notes (Signed)
 Patient says that she noticed a soft bump on her right elbow in November after bumping her arm while cooking. She says that she had a scab on her elbow, and fluid began to leak out of it, which has continued daily since Thanksgiving. She saw her PCP who told her that she ruptured her bursa and advised she see orthopedics. She was prescribed a course of antibiotics that she finished yesterday. She says that the fluid is mostly clear with a yellow tint. She does get occasional pain over the lateral elbow, but has not had much noticeable pain over the bursa itself. She keeps it covered and clean, but has otherwise not done anything to treat it at home.

## 2024-02-01 ENCOUNTER — Ambulatory Visit: Admitting: Podiatry

## 2024-02-01 ENCOUNTER — Encounter: Payer: Self-pay | Admitting: Podiatry

## 2024-02-01 DIAGNOSIS — M79675 Pain in left toe(s): Secondary | ICD-10-CM | POA: Diagnosis not present

## 2024-02-01 DIAGNOSIS — M79671 Pain in right foot: Secondary | ICD-10-CM

## 2024-02-01 DIAGNOSIS — M79672 Pain in left foot: Secondary | ICD-10-CM

## 2024-02-01 DIAGNOSIS — L608 Other nail disorders: Secondary | ICD-10-CM | POA: Diagnosis not present

## 2024-02-01 DIAGNOSIS — B351 Tinea unguium: Secondary | ICD-10-CM

## 2024-02-01 DIAGNOSIS — M79674 Pain in right toe(s): Secondary | ICD-10-CM

## 2024-02-01 NOTE — Progress Notes (Signed)
 This patient returns to the office for evaluation and treatment of long thick painful nails .  This patient is unable to trim her own nails since the patient cannot reach her feet.  Patient says the nails are painful walking and wearing his shoes.  Sh e returns for preventive foot care services.  Patient asks for only dremel use on her big toenails.  General Appearance  Alert, conversant and in no acute stress.  Vascular  Dorsalis pedis and posterior tibial  pulses are palpable  bilaterally.  Capillary return is within normal limits  bilaterally. Temperature is within normal limits  bilaterally.  Neurologic  Senn-Weinstein monofilament wire test within normal limits  bilaterally. Muscle power within normal limits bilaterally.  Nails Thick disfigured discolored nails with subungual debris  from hallux to fifth toes bilaterally. No evidence of bacterial infection or drainage bilaterally.  Orthopedic  No limitations of motion  feet .  No crepitus or effusions noted.  No bony pathology or digital deformities noted.  Skin  normotropic skin with no porokeratosis noted bilaterally.  No signs of infections or ulcers noted.    Onychomycosis  Pain in toes right foot  Pain in toes left foot    Debridement  of nails  1-5  B/L with a nail nipper.  Nails were then filed using a dremel tool with no incidents.   RTC  10 weeks    Helane Gunther DPM

## 2024-02-07 ENCOUNTER — Encounter: Payer: Self-pay | Admitting: Orthopaedic Surgery

## 2024-02-07 ENCOUNTER — Ambulatory Visit: Admitting: Sports Medicine

## 2024-02-07 ENCOUNTER — Ambulatory Visit: Admitting: Orthopaedic Surgery

## 2024-02-07 DIAGNOSIS — M7021 Olecranon bursitis, right elbow: Secondary | ICD-10-CM

## 2024-02-07 MED ORDER — CEPHALEXIN 500 MG PO CAPS: 500.0000 mg | ORAL_CAPSULE | Freq: Three times a day (TID) | ORAL | 0 refills | Status: AC

## 2024-02-07 NOTE — Progress Notes (Signed)
 "  Office Visit Note   Patient: Laura Hawkins           Date of Birth: 03-07-1950           MRN: [id number] Visit Date: 02/07/2024              Requested by: Rollene Almarie LABOR, MD [address] [place],  [place] [address] PCP: Rollene Almarie LABOR, MD   Assessment & Plan: Visit Diagnoses:  1. Olecranon bursitis, right elbow     Plan: History of Present Illness Laura Hawkins is a 73 year old female with right elbow osteoarthritis who presents for evaluation of recurrent, draining right olecranon bursitis.  Swelling over the right elbow developed prior to a routine primary care visit and progressively enlarged. At that visit, her physician diagnosed olecranon bursitis.  The swelling continued to increase and ruptured the day before Thanksgiving, creating a draining wound. She completed a course of oral antibiotics prescribed via virtual visit but is not currently on antibiotics and does not recall the specific agent.  Since rupture, the wound has repeatedly drained large volumes of watery, orange-tinged fluid, sometimes forcefully, and she has manually expressed fluid. A scab intermittently forms but reopens with further drainage, and the wound remains intermittently open and draining. She denies prior similar episodes, anticoagulant use, or diabetes.  She has right elbow osteoarthritis and is the primary caregiver for her husband, performing all driving and household tasks.  Physical Exam MUSCULOSKELETAL: Right elbow with dried and scabbed over wound and draining sinus.  Small olecranon bursitis.  No surrounding cellulitis.  Assessment and Plan Olecranon bursitis, right elbow Ruptured olecranon bursitis with persistent drainage and a draining sinus. Surgical excision indicated due to infection risk. - Scheduled outpatient surgical excision of bursa sac and draining sinus. - Planned incision over bursa with sac removal and primary skin closure. - Surgery scheduler to contact her for  scheduling, likely early January. - Prescribed oral cephalexin  to reduce preoperative infection risk. - Instructed to immobilize elbow with bandage for five days postoperatively, then early mobilization. - Will place her on Keflex .  Osteoarthritis, right elbow Osteoarthritis potentially exacerbated by prolonged postoperative immobilization. Early mobilization recommended to minimize stiffness. - Limited immobilization to one week postoperatively to minimize joint stiffness. - Advised early mobilization after initial rest to preserve range of motion.  Follow-Up Instructions: No follow-ups on file.   Orders:  No orders of the defined types were placed in this encounter.  Meds ordered this encounter  Medications   cephALEXin  (KEFLEX ) 500 MG capsule    Sig: Take 1 capsule (500 mg total) by mouth 3 (three) times daily for 14 days.    Dispense:  42 capsule    Refill:  0      Procedures: No procedures performed   Clinical Data: No additional findings.   Subjective: Chief Complaint  Patient presents with   Right Elbow - Pain    HPI  Review of Systems  Constitutional: Negative.   HENT: Negative.    Eyes: Negative.   Respiratory: Negative.    Cardiovascular: Negative.   Endocrine: Negative.   Musculoskeletal: Negative.   Neurological: Negative.   Hematological: Negative.   Psychiatric/Behavioral: Negative.    All other systems reviewed and are negative.    Objective: Vital Signs: There were no vitals taken for this visit.  Physical Exam Vitals and nursing note reviewed.  Constitutional:      Appearance: She is well-developed.  HENT:     Head: Atraumatic.  Nose: Nose normal.  Eyes:     Extraocular Movements: Extraocular movements intact.  Cardiovascular:     Pulses: Normal pulses.  Pulmonary:     Effort: Pulmonary effort is normal.  Abdominal:     Palpations: Abdomen is soft.  Musculoskeletal:     Cervical back: Neck supple.  Skin:    General: Skin  is warm.     Capillary Refill: Capillary refill takes less than 2 seconds.  Neurological:     Mental Status: She is alert. Mental status is at baseline.  Psychiatric:        Behavior: Behavior normal.        Thought Content: Thought content normal.        Judgment: Judgment normal.     Ortho Exam  Specialty Comments:  No specialty comments available.  Imaging: No results found.   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   OSA (obstructive sleep apnea) 12/14/2019   Anal fistula 07/18/2018   DDD (degenerative disc disease), lumbar 07/05/2018   History of colonic polyps 07/05/2018   Osteopenia 07/05/2018   Other hemorrhoids 07/05/2018   Gastro-esophageal reflux disease without esophagitis 03/23/2013   Unspecified hearing loss, unspecified ear 03/23/2013   Prediabetes 08/01/2012   Past Medical History:  Diagnosis Date   GERD (gastroesophageal reflux disease) 05/1990   on meds   Hyperlipidemia 1992   on meds   Hypertension 1995   on meds   Neuromuscular disorder (HCC) 2015   Sleep apnea 2008   uses CPAP   Thyroid  disease 1992   on meds    Family History  Problem Relation Age of Onset   Dementia Mother    Arthritis Mother    Hearing loss Mother    Varicose Veins Mother    Pancreatitis Father    Diabetes Father    Early death Father    Pancreatic cancer Brother    Cancer Brother    Hearing loss Maternal Grandfather    Diabetes Paternal Grandmother    Colon cancer Neg Hx    Colon polyps Neg Hx    Esophageal cancer Neg Hx    Stomach cancer Neg Hx    Rectal cancer  Neg Hx     Past Surgical History:  Procedure Laterality Date   COLONOSCOPY  2013   [place] - normal -1 polyp- 10 yr follow   dislocated right elbow Right 2013   FRACTURE SURGERY  2000   Shattered my wrist in an accident with my dogs   HAMMER TOE SURGERY Right    TREATMENT FISTULA ANAL Left 2020   WISDOM TOOTH EXTRACTION     WRIST FRACTURE SURGERY Left 2001   Social History   Occupational History   Occupation: RETIRED  Tobacco Use   Smoking status: Former   Smokeless tobacco: Never  [employer]vertising Account [employer]   Vaping status: Never Used  Substance and Sexual Activity   Alcohol use: Not Currently    Alcohol/week: 0.0 - 7.0 standard drinks of alcohol    Comment: Social   Drug use: Never   Sexual activity: Yes    Partners: Male    Birth control/protection:  Post-menopausal        "

## 2024-02-21 ENCOUNTER — Encounter: Payer: Self-pay | Admitting: Orthopaedic Surgery

## 2024-02-23 ENCOUNTER — Other Ambulatory Visit: Payer: Self-pay | Admitting: Physician Assistant

## 2024-02-23 MED ORDER — HYDROCODONE-ACETAMINOPHEN 5-325 MG PO TABS: 1.0000 | ORAL_TABLET | Freq: Three times a day (TID) | ORAL | 0 refills | Status: AC | PRN

## 2024-02-23 MED ORDER — ONDANSETRON HCL 4 MG PO TABS: 4.0000 mg | ORAL_TABLET | Freq: Three times a day (TID) | ORAL | 0 refills | Status: AC | PRN

## 2024-02-29 ENCOUNTER — Other Ambulatory Visit: Payer: Self-pay | Admitting: Physician Assistant

## 2024-03-01 DIAGNOSIS — M7021 Olecranon bursitis, right elbow: Secondary | ICD-10-CM | POA: Diagnosis not present

## 2024-03-09 ENCOUNTER — Encounter: Payer: Self-pay | Admitting: Orthopaedic Surgery

## 2024-03-13 ENCOUNTER — Ambulatory Visit: Admitting: Orthopaedic Surgery

## 2024-03-13 DIAGNOSIS — M7021 Olecranon bursitis, right elbow: Secondary | ICD-10-CM

## 2024-03-13 NOTE — Progress Notes (Signed)
 "  Post-Op Visit Note   Patient: Laura Hawkins           Date of Birth: August 08, 1950           MRN: [id number] Visit Date: 03/13/2024 PCP: Rollene Almarie LABOR, MD   Assessment & Plan:  Chief Complaint:  Chief Complaint  Patient presents with   Right Elbow - Routine Post Op    03/01/24 RT Olecranon Bursectomy    Visit Diagnoses:  1. Olecranon bursitis, right elbow     Plan: Corynne is 12 days postop from a right olecranon bursectomy.  She is doing well overall.  Denies any problems.  Examination shows healed surgical incision.  Fluid painless range of motion of the elbow.  She has a postoperative seroma.  There is no signs of infection.  Sutures removed Steri-Strips applied.  Begin elbow range of motion.  Ace bandage applied for compression.  Recheck in 4 weeks with Morna.  Follow-Up Instructions: Return in about 4 weeks (around 04/10/2024) for with lindsey.   Orders:  No orders of the defined types were placed in this encounter.  No orders of the defined types were placed in this encounter.   Imaging: No results found.  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   OSA (obstructive sleep apnea) 12/14/2019   Anal fistula 07/18/2018   DDD (degenerative disc disease), lumbar 07/05/2018   History of colonic polyps 07/05/2018   Osteopenia 07/05/2018   Other hemorrhoids 07/05/2018   Gastro-esophageal reflux disease without esophagitis 03/23/2013   Unspecified hearing loss, unspecified ear 03/23/2013   Prediabetes 08/01/2012   Past Medical History:  Diagnosis Date   GERD  (gastroesophageal reflux disease) 05/1990   on meds   Hyperlipidemia 1992   on meds   Hypertension 1995   on meds   Neuromuscular disorder (HCC) 2015   Sleep apnea 2008   uses CPAP   Thyroid  disease 1992   on meds    Family History  Problem Relation Age of Onset   Dementia Mother    Arthritis Mother    Hearing loss Mother    Varicose Veins Mother    Pancreatitis Father    Diabetes Father    Early death Father    Pancreatic cancer Brother    Cancer Brother    Hearing loss Maternal Grandfather    Diabetes Paternal Grandmother    Colon cancer Neg Hx    Colon polyps Neg Hx    Esophageal cancer Neg Hx    Stomach cancer Neg Hx    Rectal cancer Neg Hx     Past Surgical History:  Procedure Laterality Date   COLONOSCOPY  2013   [place] - normal -1 polyp- 10 yr follow   dislocated right elbow Right 2013   FRACTURE SURGERY  2000   Shattered my wrist in an accident with my dogs   HAMMER TOE SURGERY Right    TREATMENT FISTULA ANAL Left 2020   WISDOM TOOTH EXTRACTION     WRIST FRACTURE SURGERY Left 2001   Social History   Occupational History   Occupation: RETIRED  Tobacco Use   Smoking status: Former  Smokeless tobacco: Never  Vaping Use   Vaping status: Never Used  Substance and Sexual Activity   Alcohol use: Not Currently    Alcohol/week: 0.0 - 7.0 standard drinks of alcohol    Comment: Social   Drug use: Never   Sexual activity: Yes    Partners: Male    Birth control/protection: Post-menopausal     "

## 2024-04-11 ENCOUNTER — Encounter: Admitting: Physician Assistant

## 2024-04-11 ENCOUNTER — Ambulatory Visit: Admitting: Podiatry

## 2024-05-10 ENCOUNTER — Ambulatory Visit

## 2024-05-23 ENCOUNTER — Ambulatory Visit

## 2024-06-13 ENCOUNTER — Ambulatory Visit: Admitting: Podiatry

## 2024-06-29 ENCOUNTER — Ambulatory Visit: Admitting: Internal Medicine
